# Patient Record
Sex: Male | Born: 1950
Health system: Southern US, Community
[De-identification: ages and names within clinical notes are randomized; demographics above are authoritative.]

## PROBLEM LIST (undated history)

## (undated) DIAGNOSIS — G709 Myoneural disorder, unspecified: Secondary | ICD-10-CM

## (undated) DIAGNOSIS — E039 Hypothyroidism, unspecified: Secondary | ICD-10-CM

## (undated) DIAGNOSIS — E785 Hyperlipidemia, unspecified: Secondary | ICD-10-CM

## (undated) DIAGNOSIS — G4730 Sleep apnea, unspecified: Secondary | ICD-10-CM

## (undated) DIAGNOSIS — F419 Anxiety disorder, unspecified: Secondary | ICD-10-CM

## (undated) DIAGNOSIS — M109 Gout, unspecified: Secondary | ICD-10-CM

## (undated) DIAGNOSIS — M1990 Unspecified osteoarthritis, unspecified site: Secondary | ICD-10-CM

## (undated) HISTORY — PX: MULTIPLE TOOTH EXTRACTIONS: SHX2053

## (undated) HISTORY — PX: BACK SURGERY: SHX140

---

## 2002-04-02 ENCOUNTER — Ambulatory Visit (HOSPITAL_BASED_OUTPATIENT_CLINIC_OR_DEPARTMENT_OTHER): Admission: RE | Admit: 2002-04-02 | Discharge: 2002-04-02 | Payer: Self-pay | Admitting: Pulmonary Disease

## 2002-06-12 ENCOUNTER — Ambulatory Visit (HOSPITAL_BASED_OUTPATIENT_CLINIC_OR_DEPARTMENT_OTHER): Admission: RE | Admit: 2002-06-12 | Discharge: 2002-06-12 | Payer: Self-pay | Admitting: Pulmonary Disease

## 2002-08-15 ENCOUNTER — Encounter: Payer: Self-pay | Admitting: Family Medicine

## 2002-08-15 ENCOUNTER — Encounter: Admission: RE | Admit: 2002-08-15 | Discharge: 2002-08-15 | Payer: Self-pay | Admitting: Family Medicine

## 2002-09-20 ENCOUNTER — Encounter: Payer: Self-pay | Admitting: *Deleted

## 2002-09-20 ENCOUNTER — Ambulatory Visit (HOSPITAL_COMMUNITY): Admission: RE | Admit: 2002-09-20 | Discharge: 2002-09-20 | Payer: Self-pay | Admitting: Endocrinology

## 2009-07-06 ENCOUNTER — Emergency Department (HOSPITAL_COMMUNITY): Admission: EM | Admit: 2009-07-06 | Discharge: 2009-07-06 | Payer: Self-pay | Admitting: Emergency Medicine

## 2009-08-20 ENCOUNTER — Inpatient Hospital Stay (HOSPITAL_COMMUNITY): Admission: RE | Admit: 2009-08-20 | Discharge: 2009-09-15 | Payer: Self-pay | Admitting: Neurological Surgery

## 2009-08-27 ENCOUNTER — Ambulatory Visit: Payer: Self-pay | Admitting: Physical Medicine & Rehabilitation

## 2009-09-19 ENCOUNTER — Emergency Department (HOSPITAL_COMMUNITY): Admission: EM | Admit: 2009-09-19 | Discharge: 2009-09-19 | Payer: Self-pay | Admitting: Emergency Medicine

## 2011-03-10 LAB — CBC
HCT: 27.7 % — ABNORMAL LOW (ref 39.0–52.0)
HCT: 28.3 % — ABNORMAL LOW (ref 39.0–52.0)
Hemoglobin: 10.1 g/dL — ABNORMAL LOW (ref 13.0–17.0)
Hemoglobin: 9.6 g/dL — ABNORMAL LOW (ref 13.0–17.0)
MCHC: 34.3 g/dL (ref 30.0–36.0)
MCHC: 34.8 g/dL (ref 30.0–36.0)
MCHC: 35.5 g/dL (ref 30.0–36.0)
RBC: 2.99 MIL/uL — ABNORMAL LOW (ref 4.22–5.81)
RBC: 3.3 MIL/uL — ABNORMAL LOW (ref 4.22–5.81)
RDW: 13.1 % (ref 11.5–15.5)
WBC: 6.4 10*3/uL (ref 4.0–10.5)

## 2011-03-10 LAB — POCT I-STAT 4, (NA,K, GLUC, HGB,HCT)
Glucose, Bld: 99 mg/dL (ref 70–99)
HCT: 29 % — ABNORMAL LOW (ref 39.0–52.0)
Hemoglobin: 9.9 g/dL — ABNORMAL LOW (ref 13.0–17.0)

## 2011-03-10 LAB — BASIC METABOLIC PANEL
CO2: 33 mEq/L — ABNORMAL HIGH (ref 19–32)
CO2: 33 mEq/L — ABNORMAL HIGH (ref 19–32)
Calcium: 8.4 mg/dL (ref 8.4–10.5)
GFR calc Af Amer: >60 mL/min (ref >60)
Glucose, Bld: 96 mg/dL (ref 70–99)
Potassium: 4.3 mEq/L (ref 3.5–5.1)
Potassium: 4.8 mEq/L (ref 3.5–5.1)
Sodium: 137 mEq/L (ref 135–145)
Sodium: 137 mEq/L (ref 135–145)

## 2011-03-10 LAB — TYPE AND SCREEN

## 2011-03-11 LAB — TYPE AND SCREEN
ABO/RH(D): A POS
ABO/RH(D): A POS
Antibody Screen: NEGATIVE
Antibody Screen: NEGATIVE

## 2011-03-11 LAB — CBC
HCT: 33.7 % — ABNORMAL LOW (ref 39.0–52.0)
HCT: 34.8 % — ABNORMAL LOW (ref 39.0–52.0)
HCT: 47.7 % (ref 39.0–52.0)
Hemoglobin: 12.1 g/dL — ABNORMAL LOW (ref 13.0–17.0)
MCHC: 34.6 g/dL (ref 30.0–36.0)
MCV: 94.2 fL (ref 78.0–100.0)
Platelets: 100 10*3/uL — ABNORMAL LOW (ref 150–400)
Platelets: 142 10*3/uL — ABNORMAL LOW (ref 150–400)
Platelets: 143 10*3/uL — ABNORMAL LOW (ref 150–400)
RBC: 3.7 MIL/uL — ABNORMAL LOW (ref 4.22–5.81)
RDW: 13.3 % (ref 11.5–15.5)
RDW: 13.4 % (ref 11.5–15.5)
RDW: 13.5 % (ref 11.5–15.5)
WBC: 11 10*3/uL — ABNORMAL HIGH (ref 4.0–10.5)
WBC: 6.2 10*3/uL (ref 4.0–10.5)
WBC: 7.3 10*3/uL (ref 4.0–10.5)

## 2011-03-11 LAB — BASIC METABOLIC PANEL
BUN: 16 mg/dL (ref 6–23)
Calcium: 8.4 mg/dL (ref 8.4–10.5)
Chloride: 103 mEq/L (ref 96–112)
Creatinine, Ser: 1.19 mg/dL (ref 0.4–1.5)
GFR calc Af Amer: >60 mL/min (ref >60)
GFR calc non Af Amer: >60 mL/min (ref >60)
GFR calc non Af Amer: >60 mL/min (ref >60)
Glucose, Bld: 112 mg/dL — ABNORMAL HIGH (ref 70–99)
Potassium: 5.8 mEq/L — ABNORMAL HIGH (ref 3.5–5.1)
Sodium: 139 mEq/L (ref 135–145)

## 2011-03-11 LAB — COMPREHENSIVE METABOLIC PANEL
ALT: 14 U/L (ref 0–53)
CO2: 34 mEq/L — ABNORMAL HIGH (ref 19–32)
Calcium: 8.4 mg/dL (ref 8.4–10.5)
GFR calc non Af Amer: >60 mL/min (ref >60)
Glucose, Bld: 91 mg/dL (ref 70–99)
Sodium: 135 mEq/L (ref 135–145)
Total Bilirubin: 0.7 mg/dL (ref 0.3–1.2)

## 2011-03-11 LAB — DIFFERENTIAL
Basophils Absolute: 0 10*3/uL (ref 0.0–0.1)
Basophils Absolute: 0 10*3/uL (ref 0.0–0.1)
Basophils Relative: 0 % (ref 0–1)
Basophils Relative: 1 % (ref 0–1)
Eosinophils Absolute: 0 10*3/uL (ref 0.0–0.7)
Eosinophils Absolute: 0.1 10*3/uL (ref 0.0–0.7)
Eosinophils Relative: 0 % (ref 0–5)
Lymphocytes Relative: 3 % — ABNORMAL LOW (ref 12–46)
Lymphs Abs: 0.4 10*3/uL — ABNORMAL LOW (ref 0.7–4.0)
Lymphs Abs: 1.3 10*3/uL (ref 0.7–4.0)
Monocytes Absolute: 0.3 10*3/uL (ref 0.1–1.0)
Monocytes Relative: 2 % — ABNORMAL LOW (ref 3–12)
Neutro Abs: 10.3 10*3/uL — ABNORMAL HIGH (ref 1.7–7.7)
Neutrophils Relative %: 70 % (ref 43–77)
Neutrophils Relative %: 94 % — ABNORMAL HIGH (ref 43–77)

## 2011-03-11 LAB — ABO/RH: ABO/RH(D): A POS

## 2011-03-11 LAB — CARBAMAZEPINE LEVEL, TOTAL: Carbamazepine Lvl: 9.1 ug/mL (ref 4.0–12.0)

## 2012-12-06 DIAGNOSIS — R030 Elevated blood-pressure reading, without diagnosis of hypertension: Secondary | ICD-10-CM | POA: Diagnosis not present

## 2012-12-06 DIAGNOSIS — M545 Low back pain, unspecified: Secondary | ICD-10-CM | POA: Diagnosis not present

## 2012-12-06 DIAGNOSIS — G4733 Obstructive sleep apnea (adult) (pediatric): Secondary | ICD-10-CM | POA: Diagnosis not present

## 2012-12-06 DIAGNOSIS — F411 Generalized anxiety disorder: Secondary | ICD-10-CM | POA: Diagnosis not present

## 2013-01-29 DIAGNOSIS — M545 Low back pain, unspecified: Secondary | ICD-10-CM | POA: Insufficient documentation

## 2013-02-12 DIAGNOSIS — E785 Hyperlipidemia, unspecified: Secondary | ICD-10-CM | POA: Diagnosis not present

## 2013-02-12 DIAGNOSIS — E039 Hypothyroidism, unspecified: Secondary | ICD-10-CM | POA: Diagnosis not present

## 2013-02-12 DIAGNOSIS — R319 Hematuria, unspecified: Secondary | ICD-10-CM | POA: Diagnosis not present

## 2013-02-12 DIAGNOSIS — M25529 Pain in unspecified elbow: Secondary | ICD-10-CM | POA: Diagnosis not present

## 2013-03-05 DIAGNOSIS — N400 Enlarged prostate without lower urinary tract symptoms: Secondary | ICD-10-CM | POA: Diagnosis not present

## 2013-03-05 DIAGNOSIS — M109 Gout, unspecified: Secondary | ICD-10-CM | POA: Diagnosis not present

## 2013-03-05 DIAGNOSIS — R3129 Other microscopic hematuria: Secondary | ICD-10-CM | POA: Diagnosis not present

## 2013-03-21 ENCOUNTER — Other Ambulatory Visit: Payer: Self-pay | Admitting: Gastroenterology

## 2013-03-21 DIAGNOSIS — N281 Cyst of kidney, acquired: Secondary | ICD-10-CM | POA: Diagnosis not present

## 2013-03-21 DIAGNOSIS — R3129 Other microscopic hematuria: Secondary | ICD-10-CM | POA: Diagnosis not present

## 2013-03-25 DIAGNOSIS — R3129 Other microscopic hematuria: Secondary | ICD-10-CM | POA: Diagnosis not present

## 2013-04-12 ENCOUNTER — Encounter (HOSPITAL_COMMUNITY): Payer: Self-pay | Admitting: *Deleted

## 2013-04-12 ENCOUNTER — Ambulatory Visit (HOSPITAL_COMMUNITY)
Admission: RE | Admit: 2013-04-12 | Discharge: 2013-04-12 | Disposition: A | Discharge status: Home / Self Care | Payer: BC Managed Care – PPO | Source: Ambulatory Visit | Attending: Gastroenterology | Admitting: Gastroenterology

## 2013-04-12 ENCOUNTER — Encounter (HOSPITAL_COMMUNITY)
Admission: RE | Disposition: A | Discharge status: Home / Self Care | Payer: Self-pay | Source: Ambulatory Visit | Attending: Gastroenterology

## 2013-04-12 DIAGNOSIS — D126 Benign neoplasm of colon, unspecified: Secondary | ICD-10-CM | POA: Insufficient documentation

## 2013-04-12 DIAGNOSIS — K644 Residual hemorrhoidal skin tags: Secondary | ICD-10-CM | POA: Insufficient documentation

## 2013-04-12 DIAGNOSIS — K648 Other hemorrhoids: Secondary | ICD-10-CM | POA: Insufficient documentation

## 2013-04-12 DIAGNOSIS — G4730 Sleep apnea, unspecified: Secondary | ICD-10-CM | POA: Insufficient documentation

## 2013-04-12 DIAGNOSIS — E039 Hypothyroidism, unspecified: Secondary | ICD-10-CM | POA: Insufficient documentation

## 2013-04-12 DIAGNOSIS — Z800 Family history of malignant neoplasm of digestive organs: Secondary | ICD-10-CM | POA: Insufficient documentation

## 2013-04-12 DIAGNOSIS — D128 Benign neoplasm of rectum: Secondary | ICD-10-CM | POA: Diagnosis not present

## 2013-04-12 DIAGNOSIS — Z1211 Encounter for screening for malignant neoplasm of colon: Secondary | ICD-10-CM | POA: Insufficient documentation

## 2013-04-12 DIAGNOSIS — E785 Hyperlipidemia, unspecified: Secondary | ICD-10-CM | POA: Insufficient documentation

## 2013-04-12 HISTORY — PX: COLONOSCOPY: SHX5424

## 2013-04-12 HISTORY — DX: Hypothyroidism, unspecified: E03.9

## 2013-04-12 HISTORY — DX: Hyperlipidemia, unspecified: E78.5

## 2013-04-12 HISTORY — DX: Sleep apnea, unspecified: G47.30

## 2013-04-12 HISTORY — DX: Unspecified osteoarthritis, unspecified site: M19.90

## 2013-04-12 HISTORY — DX: Myoneural disorder, unspecified: G70.9

## 2013-04-12 HISTORY — DX: Anxiety disorder, unspecified: F41.9

## 2013-04-12 SURGERY — COLONOSCOPY
Anesthesia: Moderate Sedation

## 2013-04-12 MED ORDER — MIDAZOLAM HCL 5 MG/5ML IJ SOLN
INTRAMUSCULAR | Status: DC | PRN
  Administered 2013-04-12 (×6): 2 mg via INTRAVENOUS

## 2013-04-12 MED ORDER — SODIUM CHLORIDE 0.9 % IV SOLN: INTRAVENOUS | Status: DC

## 2013-04-12 MED ORDER — FENTANYL CITRATE 0.05 MG/ML IJ SOLN
INTRAMUSCULAR | Status: AC
  Filled 2013-04-12: qty 4

## 2013-04-12 MED ORDER — MIDAZOLAM HCL 10 MG/2ML IJ SOLN
INTRAMUSCULAR | Status: AC
  Filled 2013-04-12: qty 4

## 2013-04-12 MED ORDER — FENTANYL CITRATE 0.05 MG/ML IJ SOLN
INTRAMUSCULAR | Status: DC | PRN
  Administered 2013-04-12 (×6): 25 ug via INTRAVENOUS

## 2013-04-12 NOTE — H&P (Signed)
Reason for Consult: Screening colonoscopy Referring Physician: Mack Hook, M.D.  Ramon Dredge HPI: This is a 62 year old male referred for a screening colonoscopy.  He is asymptomatic from the GI standpoint.  His grand mother died of colon cancer in her 19's.  Past Medical History  Diagnosis Date  . Hypothyroidism   . Hyperlipemia   . Sleep apnea     uses Cpap  . Neuromuscular disorder   . Arthritis   . Anxiety     Past Surgical History  Procedure Laterality Date  . Back surgery      x 3  . Multiple tooth extractions      History reviewed. No pertinent family history.  Social History:  reports that he has never smoked. He has never used smokeless tobacco. He reports that he does not drink alcohol or use illicit drugs.  Allergies: No Known Allergies  Medications:  Scheduled:  Continuous: . sodium chloride      No results found for this or any previous visit (from the past 24 hour(s)).   No results found.  ROS:  As stated above in the HPI otherwise negative.  Blood pressure 152/84, pulse 70, temperature 98.4 F (36.9 C), temperature source Oral, resp. rate 17, height 5\' 11"  (1.803 m), weight 240 lb (108.863 kg), SpO2 17.00%.    PE: Gen: NAD, Alert and Oriented HEENT:  [place]/AT, EOMI Neck: Supple, no LAD Lungs: CTA Bilaterally CV: RRR without M/G/R ABM: Soft, NTND, +BS Ext: No C/C/E  Assessment/Plan: 1) Screening colonoscopy.  Plan: 1) Colonoscopy today.  Margeret Stachnik D 04/12/2013, 9:20 AM

## 2013-04-12 NOTE — Op Note (Addendum)
[facility] [address] [place], [address]   OPERATIVE PROCEDURE REPORT  PATIENT: Tyleek, Smick  MR#: [id number] BIRTHDATE: 09/12/1951  GENDER: Male ENDOSCOPIST: Jeani Hawking, MD ASSISTANT:   Jamal Maes, RN Dorisann Frames, technician Jadene Pierini, technician PROCEDURE DATE: 04/12/2013 PROCEDURE:   Colonoscopy with snare polypectomy ASA CLASS:   Class II INDICATIONS:Colorectal cancer screening. MEDICATIONS: Versed 12 mg IV and Fentanyl 150 mcg IV  DESCRIPTION OF PROCEDURE:   After the risks benefits and alternatives of the procedure were thoroughly explained, informed consent was obtained.  A digital rectal exam revealed no abnormalities of the rectum.    The Pentax Ped Colon [id number] endoscope was introduced through the anus  and advanced to the cecum, which was identified by both the appendix and ileocecal valve , No adverse events experienced.    The quality of the prep was excellent. .  The instrument was then slowly withdrawn as the colon was fully examined.     FINDINGS: A 3 mm sessile descending colon polyp was removed with a cold sanre.  No evidence of any masses, inflammation, ulcerations, erosions, or vascular abnormalities.   Retroflexed views revealed internal/external hemorrhoids.     The scope was then withdrawn from the patient and the procedure terminated.  COMPLICATIONS: There were no complications.  IMPRESSION: 1) Polyp. 2) Int/Ext Hemorrhoids.  RECOMMENDATIONS: 1.  Await biopsy results 2.  Repeat colonoscopy in 5-10 years.   _______________________________ eSignedJeani Hawking, MD 04/12/2013 9:57 AM

## 2013-04-15 ENCOUNTER — Encounter (HOSPITAL_COMMUNITY): Payer: Self-pay | Admitting: Gastroenterology

## 2013-05-23 DIAGNOSIS — E039 Hypothyroidism, unspecified: Secondary | ICD-10-CM | POA: Diagnosis not present

## 2013-05-23 DIAGNOSIS — E785 Hyperlipidemia, unspecified: Secondary | ICD-10-CM | POA: Diagnosis not present

## 2013-08-12 DIAGNOSIS — D485 Neoplasm of uncertain behavior of skin: Secondary | ICD-10-CM | POA: Diagnosis not present

## 2013-08-29 DIAGNOSIS — E785 Hyperlipidemia, unspecified: Secondary | ICD-10-CM | POA: Diagnosis not present

## 2013-08-29 DIAGNOSIS — E039 Hypothyroidism, unspecified: Secondary | ICD-10-CM | POA: Diagnosis not present

## 2013-08-29 DIAGNOSIS — Z23 Encounter for immunization: Secondary | ICD-10-CM | POA: Diagnosis not present

## 2013-11-05 DIAGNOSIS — H6690 Otitis media, unspecified, unspecified ear: Secondary | ICD-10-CM | POA: Diagnosis not present

## 2014-02-26 DIAGNOSIS — E785 Hyperlipidemia, unspecified: Secondary | ICD-10-CM | POA: Diagnosis not present

## 2014-02-26 DIAGNOSIS — G4733 Obstructive sleep apnea (adult) (pediatric): Secondary | ICD-10-CM | POA: Diagnosis not present

## 2014-02-26 DIAGNOSIS — F411 Generalized anxiety disorder: Secondary | ICD-10-CM | POA: Diagnosis not present

## 2014-02-26 DIAGNOSIS — E039 Hypothyroidism, unspecified: Secondary | ICD-10-CM | POA: Diagnosis not present

## 2014-08-28 DIAGNOSIS — F411 Generalized anxiety disorder: Secondary | ICD-10-CM | POA: Diagnosis not present

## 2014-08-28 DIAGNOSIS — G4733 Obstructive sleep apnea (adult) (pediatric): Secondary | ICD-10-CM | POA: Diagnosis not present

## 2014-08-28 DIAGNOSIS — E039 Hypothyroidism, unspecified: Secondary | ICD-10-CM | POA: Diagnosis not present

## 2014-08-28 DIAGNOSIS — E785 Hyperlipidemia, unspecified: Secondary | ICD-10-CM | POA: Diagnosis not present

## 2015-07-11 ENCOUNTER — Emergency Department
Admission: EM | Admit: 2015-07-11 | Discharge: 2015-07-11 | Disposition: A | Discharge status: Home / Self Care | Payer: No Typology Code available for payment source | Attending: Emergency Medicine | Admitting: Emergency Medicine

## 2015-07-11 ENCOUNTER — Encounter: Payer: Self-pay | Admitting: Emergency Medicine

## 2015-07-11 ENCOUNTER — Emergency Department: Payer: No Typology Code available for payment source

## 2015-07-11 DIAGNOSIS — Y9389 Activity, other specified: Secondary | ICD-10-CM | POA: Insufficient documentation

## 2015-07-11 DIAGNOSIS — Y998 Other external cause status: Secondary | ICD-10-CM | POA: Diagnosis not present

## 2015-07-11 DIAGNOSIS — F419 Anxiety disorder, unspecified: Secondary | ICD-10-CM | POA: Diagnosis not present

## 2015-07-11 DIAGNOSIS — S39012A Strain of muscle, fascia and tendon of lower back, initial encounter: Secondary | ICD-10-CM

## 2015-07-11 DIAGNOSIS — Y92410 Unspecified street and highway as the place of occurrence of the external cause: Secondary | ICD-10-CM | POA: Insufficient documentation

## 2015-07-11 DIAGNOSIS — Z79899 Other long term (current) drug therapy: Secondary | ICD-10-CM | POA: Diagnosis not present

## 2015-07-11 DIAGNOSIS — S161XXA Strain of muscle, fascia and tendon at neck level, initial encounter: Secondary | ICD-10-CM

## 2015-07-11 DIAGNOSIS — S199XXA Unspecified injury of neck, initial encounter: Secondary | ICD-10-CM | POA: Diagnosis present

## 2015-07-11 MED ORDER — HYDROXYZINE HCL 50 MG PO TABS
50.0000 mg | ORAL_TABLET | Freq: Once | ORAL | Status: AC
  Administered 2015-07-11: 50 mg via ORAL
  Filled 2015-07-11: qty 1

## 2015-07-11 MED ORDER — HYDROMORPHONE HCL 1 MG/ML IJ SOLN
0.5000 mg | Freq: Once | INTRAMUSCULAR | Status: AC
  Administered 2015-07-11: 0.5 mg via INTRAMUSCULAR
  Filled 2015-07-11: qty 1

## 2015-07-11 MED ORDER — ORPHENADRINE CITRATE 30 MG/ML IJ SOLN
60.0000 mg | Freq: Two times a day (BID) | INTRAMUSCULAR | Status: DC
  Administered 2015-07-11: 60 mg via INTRAVENOUS
  Filled 2015-07-11: qty 2
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
## 2015-07-11 NOTE — Discharge Instructions (Signed)
Continue previous mediations.  Follow up with Pain ManagementDoctor.
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
## 2015-07-14 ENCOUNTER — Emergency Department (INDEPENDENT_AMBULATORY_CARE_PROVIDER_SITE_OTHER)
Admission: EM | Admit: 2015-07-14 | Discharge: 2015-07-14 | Disposition: A | Discharge status: Home / Self Care | Payer: Self-pay | Source: Home / Self Care | Attending: Emergency Medicine | Admitting: Emergency Medicine

## 2015-07-14 ENCOUNTER — Encounter (HOSPITAL_COMMUNITY): Payer: Self-pay | Admitting: Emergency Medicine

## 2015-07-14 DIAGNOSIS — S161XXA Strain of muscle, fascia and tendon at neck level, initial encounter: Secondary | ICD-10-CM

## 2015-07-14 MED ORDER — CYCLOBENZAPRINE HCL 5 MG PO TABS: 5.0000 mg | ORAL_TABLET | Freq: Three times a day (TID) | ORAL | Status: DC | PRN
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
## 2015-07-14 NOTE — Discharge Instructions (Signed)
Your spine and spinal cord are good. You strained the muscles in your neck. I think you may have a small tear in one of your neck muscles. Take Flexeril 1-2 tablets every 8 hours as needed. Apply heat to the affected area for 20 minutes every 1-2 hours. This should start to improve over the next few days. It will take several weeks to fully resolve. Follow-up with your doctor as needed.
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
## 2016-05-26 DIAGNOSIS — E785 Hyperlipidemia, unspecified: Secondary | ICD-10-CM | POA: Diagnosis not present

## 2016-05-26 DIAGNOSIS — Z125 Encounter for screening for malignant neoplasm of prostate: Secondary | ICD-10-CM | POA: Diagnosis not present

## 2016-05-26 DIAGNOSIS — Z0000 Encounter for general adult medical examination without abnormal findings: Secondary | ICD-10-CM | POA: Diagnosis not present

## 2016-06-02 DIAGNOSIS — F339 Major depressive disorder, recurrent, unspecified: Secondary | ICD-10-CM | POA: Diagnosis not present

## 2016-06-02 DIAGNOSIS — D696 Thrombocytopenia, unspecified: Secondary | ICD-10-CM | POA: Diagnosis not present

## 2016-06-02 DIAGNOSIS — E785 Hyperlipidemia, unspecified: Secondary | ICD-10-CM | POA: Diagnosis not present

## 2016-06-02 DIAGNOSIS — E039 Hypothyroidism, unspecified: Secondary | ICD-10-CM | POA: Diagnosis not present

## 2016-08-12 DIAGNOSIS — H0011 Chalazion right upper eyelid: Secondary | ICD-10-CM | POA: Diagnosis not present

## 2017-05-23 DIAGNOSIS — Z125 Encounter for screening for malignant neoplasm of prostate: Secondary | ICD-10-CM | POA: Diagnosis not present

## 2017-05-23 DIAGNOSIS — E785 Hyperlipidemia, unspecified: Secondary | ICD-10-CM | POA: Diagnosis not present

## 2017-05-23 DIAGNOSIS — Z0000 Encounter for general adult medical examination without abnormal findings: Secondary | ICD-10-CM | POA: Diagnosis not present

## 2017-06-02 DIAGNOSIS — E039 Hypothyroidism, unspecified: Secondary | ICD-10-CM | POA: Diagnosis not present

## 2017-06-02 DIAGNOSIS — F419 Anxiety disorder, unspecified: Secondary | ICD-10-CM | POA: Diagnosis not present

## 2017-06-02 DIAGNOSIS — G4733 Obstructive sleep apnea (adult) (pediatric): Secondary | ICD-10-CM | POA: Diagnosis not present

## 2017-06-02 DIAGNOSIS — Z0000 Encounter for general adult medical examination without abnormal findings: Secondary | ICD-10-CM | POA: Diagnosis not present
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
## 2018-03-28 DIAGNOSIS — R69 Illness, unspecified: Secondary | ICD-10-CM | POA: Diagnosis not present

## 2018-05-01 DIAGNOSIS — H00021 Hordeolum internum right upper eyelid: Secondary | ICD-10-CM | POA: Diagnosis not present

## 2018-05-29 DIAGNOSIS — E785 Hyperlipidemia, unspecified: Secondary | ICD-10-CM | POA: Diagnosis not present

## 2018-05-29 DIAGNOSIS — Z125 Encounter for screening for malignant neoplasm of prostate: Secondary | ICD-10-CM | POA: Diagnosis not present

## 2018-05-29 DIAGNOSIS — Z0000 Encounter for general adult medical examination without abnormal findings: Secondary | ICD-10-CM | POA: Diagnosis not present

## 2018-05-29 DIAGNOSIS — E039 Hypothyroidism, unspecified: Secondary | ICD-10-CM | POA: Diagnosis not present

## 2018-05-29 DIAGNOSIS — H0011 Chalazion right upper eyelid: Secondary | ICD-10-CM | POA: Diagnosis not present

## 2018-06-05 DIAGNOSIS — E782 Mixed hyperlipidemia: Secondary | ICD-10-CM | POA: Diagnosis not present

## 2018-06-05 DIAGNOSIS — R69 Illness, unspecified: Secondary | ICD-10-CM | POA: Diagnosis not present

## 2018-06-05 DIAGNOSIS — Z0000 Encounter for general adult medical examination without abnormal findings: Secondary | ICD-10-CM | POA: Diagnosis not present

## 2018-06-05 DIAGNOSIS — E039 Hypothyroidism, unspecified: Secondary | ICD-10-CM | POA: Diagnosis not present

## 2018-06-05 DIAGNOSIS — Z993 Dependence on wheelchair: Secondary | ICD-10-CM | POA: Diagnosis not present

## 2018-06-05 DIAGNOSIS — G4733 Obstructive sleep apnea (adult) (pediatric): Secondary | ICD-10-CM | POA: Diagnosis not present

## 2018-07-11 DIAGNOSIS — R2231 Localized swelling, mass and lump, right upper limb: Secondary | ICD-10-CM | POA: Diagnosis not present

## 2018-07-11 DIAGNOSIS — M79641 Pain in right hand: Secondary | ICD-10-CM | POA: Diagnosis not present

## 2018-07-11 DIAGNOSIS — M25531 Pain in right wrist: Secondary | ICD-10-CM | POA: Diagnosis not present

## 2018-07-11 DIAGNOSIS — M7989 Other specified soft tissue disorders: Secondary | ICD-10-CM | POA: Diagnosis not present

## 2018-07-11 DIAGNOSIS — E039 Hypothyroidism, unspecified: Secondary | ICD-10-CM | POA: Diagnosis not present

## 2018-07-11 DIAGNOSIS — Z79899 Other long term (current) drug therapy: Secondary | ICD-10-CM | POA: Diagnosis not present

## 2018-07-12 ENCOUNTER — Other Ambulatory Visit: Payer: Self-pay

## 2018-07-12 ENCOUNTER — Emergency Department (HOSPITAL_COMMUNITY)
Admission: EM | Admit: 2018-07-12 | Discharge: 2018-07-12 | Disposition: A | Discharge status: Home / Self Care | Payer: Medicare HMO | Attending: Emergency Medicine | Admitting: Emergency Medicine

## 2018-07-12 ENCOUNTER — Encounter (HOSPITAL_COMMUNITY): Payer: Self-pay | Admitting: Emergency Medicine

## 2018-07-12 ENCOUNTER — Emergency Department (HOSPITAL_COMMUNITY): Payer: Medicare HMO

## 2018-07-12 DIAGNOSIS — E039 Hypothyroidism, unspecified: Secondary | ICD-10-CM | POA: Diagnosis not present

## 2018-07-12 DIAGNOSIS — R2231 Localized swelling, mass and lump, right upper limb: Secondary | ICD-10-CM | POA: Diagnosis not present

## 2018-07-12 DIAGNOSIS — M7989 Other specified soft tissue disorders: Secondary | ICD-10-CM | POA: Diagnosis not present

## 2018-07-12 DIAGNOSIS — M79641 Pain in right hand: Secondary | ICD-10-CM | POA: Diagnosis not present

## 2018-07-12 DIAGNOSIS — M25431 Effusion, right wrist: Secondary | ICD-10-CM

## 2018-07-12 DIAGNOSIS — M25531 Pain in right wrist: Secondary | ICD-10-CM

## 2018-07-12 DIAGNOSIS — Z79899 Other long term (current) drug therapy: Secondary | ICD-10-CM | POA: Diagnosis not present

## 2018-07-12 LAB — CBC WITH DIFFERENTIAL/PLATELET
BASOS ABS: 0 10*3/uL (ref 0.0–0.1)
Basophils Relative: 0 %
EOS PCT: 0 %
Eosinophils Absolute: 0.1 10*3/uL (ref 0.0–0.7)
HCT: 44.9 % (ref 39.0–52.0)
Hemoglobin: 15.6 g/dL (ref 13.0–17.0)
LYMPHS PCT: 8 %
Lymphs Abs: 1 10*3/uL (ref 0.7–4.0)
MCH: 32.6 pg (ref 26.0–34.0)
MCHC: 34.7 g/dL (ref 30.0–36.0)
MCV: 93.7 fL (ref 78.0–100.0)
Monocytes Absolute: 1.2 10*3/uL — ABNORMAL HIGH (ref 0.1–1.0)
Monocytes Relative: 9 %
Neutro Abs: 10.1 10*3/uL — ABNORMAL HIGH (ref 1.7–7.7)
Neutrophils Relative %: 83 %
PLATELETS: 141 10*3/uL — AB (ref 150–400)
RBC: 4.79 MIL/uL (ref 4.22–5.81)
RDW: 13.4 % (ref 11.5–15.5)
WBC: 12.3 10*3/uL — ABNORMAL HIGH (ref 4.0–10.5)

## 2018-07-12 LAB — BASIC METABOLIC PANEL
ANION GAP: 6 (ref 5–15)
BUN: 17 mg/dL (ref 8–23)
CALCIUM: 9.2 mg/dL (ref 8.9–10.3)
CO2: 30 mmol/L (ref 22–32)
CREATININE: 1.09 mg/dL (ref 0.61–1.24)
Chloride: 99 mmol/L (ref 98–111)
GFR calc Af Amer: >60 mL/min (ref >60)
GLUCOSE: 113 mg/dL — AB (ref 70–99)
Potassium: 4.3 mmol/L (ref 3.5–5.1)
Sodium: 135 mmol/L (ref 135–145)

## 2018-07-12 LAB — URIC ACID: Uric Acid, Serum: 7.6 mg/dL (ref 3.7–8.6)

## 2018-07-12 MED ORDER — POVIDONE-IODINE 10 % EX SOLN
CUTANEOUS | Status: AC
  Administered 2018-07-12: 1
  Filled 2018-07-12: qty 30

## 2018-07-12 MED ORDER — NAPROXEN 500 MG PO TABS: 500.0000 mg | ORAL_TABLET | Freq: Two times a day (BID) | ORAL | 0 refills | Status: DC

## 2018-07-12 MED ORDER — DOXYCYCLINE HYCLATE 100 MG PO CAPS: 100.0000 mg | ORAL_CAPSULE | Freq: Two times a day (BID) | ORAL | 0 refills | Status: DC

## 2018-07-12 MED ORDER — PREDNISONE 10 MG PO TABS
60.0000 mg | ORAL_TABLET | Freq: Once | ORAL | Status: AC
Start: 2018-07-12 — End: 2018-07-12
  Administered 2018-07-12: 60 mg via ORAL
  Filled 2018-07-12: qty 1

## 2018-07-12 MED ORDER — COLCHICINE 0.6 MG PO TABS
0.6000 mg | ORAL_TABLET | Freq: Once | ORAL | Status: AC
Start: 2018-07-12 — End: 2018-07-12
  Administered 2018-07-12: 0.6 mg via ORAL
  Filled 2018-07-12: qty 1

## 2018-07-12 MED ORDER — OXYCODONE-ACETAMINOPHEN 5-325 MG PO TABS
2.0000 | ORAL_TABLET | Freq: Once | ORAL | Status: AC
  Administered 2018-07-12: 2 via ORAL
  Filled 2018-07-12: qty 2

## 2018-07-12 MED ORDER — COLCHICINE 0.6 MG PO TABS: 0.6000 mg | ORAL_TABLET | Freq: Two times a day (BID) | ORAL | 0 refills | Status: AC

## 2018-07-12 NOTE — ED Provider Notes (Signed)
[facility] EMERGENCY DEPARTMENT Provider Note   CSN: [id number] Arrival date & time: 07/11/18  2356     History   Chief Complaint Chief Complaint  Patient presents with  . Wrist Pain    HPI Ricky Hoffman is a 67 y.o. male.  Patient presents with 3-day history of right hand and wrist pain denies injury.  States this started his pain in his second MCP joint but has progressed up to his wrist causing pain and having difficulty moving his wrist.  No fall or trauma.  No fever.  Does have a history of gout but has never had any joint aspirations.  He takes chronic pain medication Nucynta for his back but this is not helping his wrist.  He denies any other joint pain.  Denies any fever, chills, nausea or vomiting.  No weakness, numbness or tingling.  The history is provided by the patient.  Wrist Pain  Pertinent negatives include no chest pain, no abdominal pain and no shortness of breath.    Past Medical History:  Diagnosis Date  . Anxiety   . Arthritis   . Hyperlipemia   . Hypothyroidism   . Neuromuscular disorder ([place])   . Sleep apnea    uses Cpap    There are no active problems to display for this patient.   Past Surgical History:  Procedure Laterality Date  . BACK SURGERY     x 3  . COLONOSCOPY N/A 04/12/2013   Procedure: COLONOSCOPY;  Surgeon: Beryle Beams, MD;  Location: [facility];  Service: Endoscopy;  Laterality: N/A;  . MULTIPLE TOOTH EXTRACTIONS          Home Medications    Prior to Admission medications   Medication Sig Start Date End Date Taking? Authorizing Provider  cyclobenzaprine (FLEXERIL) 5 MG tablet Take 1-2 tablets (5-10 mg total) by mouth 3 (three) times daily as needed for muscle spasms. 07/14/15   Melony Overly, MD  DIAZEPAM PO Take 5 mg by mouth every 6 (six) hours as needed.    [provider]  escitalopram (LEXAPRO) 10 MG tablet Take 10 mg by mouth daily.    [provider]  ESCITALOPRAM OXALATE PO Take by mouth.     [provider]  gabapentin (NEURONTIN) 100 MG capsule Take 100 mg by mouth daily.    [provider]  Gabapentin, Once-Daily, (GRALISE) 300 MG TABS Take 900 mg by mouth daily before supper.    [provider]  L-Methylfolate-B6-B12 (METANX PO) Take by mouth every 12 (twelve) hours.    [provider]  l-methylfolate-B6-B12 (METANX) 3-35-2 MG TABS Take 1 tablet by mouth daily.    [provider]  levothyroxine (SYNTHROID, LEVOTHROID) 50 MCG tablet Take 50 mcg by mouth daily before breakfast.    [provider]  Levothyroxine Sodium 25 MCG CAPS Take by mouth daily before breakfast.    [provider]  NON FORMULARY 600 mg daily. Alpha Lipoic Acid    [provider]  tapentadol (NUCYNTA) 50 MG TABS tablet Take 150 mg by mouth every 4 (four) hours as needed.    [provider]  Tapentadol HCl (NUCYNTA PO) Take 500 mg by mouth every 4 (four) hours as needed.    [provider]    Family History No family history on file.  Social History Social History   Tobacco Use  . Smoking status: Never Smoker  . Smokeless tobacco: Never Used  Substance Use Topics  . Alcohol use: No  .  Drug use: No     Allergies   Patient has no known allergies.   Review of Systems Review of Systems  Constitutional: Negative for activity change, appetite change and fever.  HENT: Negative for congestion.   Respiratory: Negative for cough, chest tightness and shortness of breath.   Cardiovascular: Negative for chest pain.  Gastrointestinal: Negative for abdominal pain, nausea and vomiting.  Genitourinary: Negative for discharge, dysuria, genital sores and urgency.  Musculoskeletal: Positive for arthralgias and myalgias.  Skin: Negative for rash and wound.  Neurological: Negative for dizziness, weakness, light-headedness and numbness.    all other systems are negative except as noted in the HPI and PMH.    Physical  Exam Updated Vital Signs BP 130/80   Pulse 86   Temp 98.2 F (36.8 C)   Resp 18   Ht 5\' 11"  (1.803 m)   Wt 112.5 kg   SpO2 97%   BMI 34.59 kg/m   Physical Exam  Constitutional: He is oriented to person, place, and time. He appears well-developed and well-nourished. No distress.  HENT:  Head: Normocephalic and atraumatic.  Mouth/Throat: Oropharynx is clear and moist. No oropharyngeal exudate.  Eyes: Pupils are equal, round, and reactive to light. Conjunctivae and EOM are normal.  Neck: Normal range of motion. Neck supple.  No meningismus.  Cardiovascular: Normal rate, regular rhythm, normal heart sounds and intact distal pulses.  No murmur heard. Pulmonary/Chest: Effort normal and breath sounds normal. No respiratory distress.  Abdominal: Soft. There is no tenderness. There is no rebound and no guarding.  Musculoskeletal: He exhibits edema and tenderness.  Right wrist is diffusely swollen and tender with reduced range of motion.  There is no significant erythema.  It is warm to palpation.  Intact radial pulse.  There is some erythema overlying his second MCP joint as well.  He is able to flex and extend all of his fingers and thumb.  Reduced range of motion at wrist secondary to pain.  See photograph  Neurological: He is alert and oriented to person, place, and time. No cranial nerve deficit. He exhibits normal muscle tone. Coordination normal.  No ataxia on finger to nose bilaterally. No pronator drift. 5/5 strength throughout. CN 2-12 intact.Equal grip strength. Sensation intact.   Skin: Skin is warm.  Psychiatric: He has a normal mood and affect. His behavior is normal.  Nursing note and vitals reviewed.        ED Treatments / Results  Labs (all labs ordered are listed, but only abnormal results are displayed) Labs Reviewed  CBC WITH DIFFERENTIAL/PLATELET - Abnormal; Notable for the following components:      Result Value   WBC 12.3 (*)    Platelets 141 (*)     Neutro Abs 10.1 (*)    Monocytes Absolute 1.2 (*)    All other components within normal limits  BASIC METABOLIC PANEL - Abnormal; Notable for the following components:   Glucose, Bld 113 (*)    All other components within normal limits  URIC ACID    EKG None  Radiology Dg Wrist Complete Right  Result Date: 07/12/2018 CLINICAL DATA:  Acute onset of right hand pain, extending to the wrist. EXAM: RIGHT WRIST - COMPLETE 3+ VIEW COMPARISON:  None. FINDINGS: There is no evidence of fracture or dislocation. The carpal rows are intact, and demonstrate normal alignment. The joint spaces are preserved. Mild negative ulnar variance is noted. Subcortical cystic change is noted at the distal radius, with mild associated sclerosis. Soft  tissue swelling is noted about the wrist. IMPRESSION: No evidence of fracture or dislocation. Electronically Signed   By: Garald Balding M.D.   On: 07/12/2018 00:53   Dg Hand Complete Right  Result Date: 07/12/2018 CLINICAL DATA:  Acute onset of right hand pain at the head of the second metacarpal. Initial encounter. EXAM: RIGHT HAND - COMPLETE 3+ VIEW COMPARISON:  None. FINDINGS: There is no evidence of fracture or dislocation. Slight deformity of the distal second metatarsal appears to reflect remote traumatic injury. No osseous erosions are seen. The joint spaces are preserved. The carpal rows are intact, and demonstrate normal alignment. Mild negative ulnar variance is noted. Mild soft tissue swelling is noted about the wrist. IMPRESSION: No evidence of fracture or dislocation. Electronically Signed   By: Garald Balding M.D.   On: 07/12/2018 00:52    Procedures .Joint Aspiration/Arthrocentesis Date/Time: 07/12/2018 5:43 AM Performed by: Ezequiel Essex, MD Authorized by: Ezequiel Essex, MD   Consent:    Consent obtained:  Verbal   Consent given by:  Patient   Risks discussed:  Infection, nerve damage and pain Location:    Location:  Wrist   Wrist:  R  radiocarpal Anesthesia (see MAR for exact dosages):    Anesthesia method:  None Procedure details:    Preparation: Patient was prepped and draped in usual sterile fashion     Needle gauge:  18 G   Ultrasound guidance: no     Approach:  Medial   Aspirate amount:  0   Steroid injected: no     Specimen collected: no   Post-procedure details:    Dressing:  Adhesive bandage   Patient tolerance of procedure:  Tolerated well, no immediate complications Comments:     No fluid obtained   (including critical care time)  Medications Ordered in ED Medications  oxyCODONE-acetaminophen (PERCOCET/ROXICET) 5-325 MG per tablet 2 tablet (2 tablets Oral Given 07/12/18 0402)  colchicine tablet 0.6 mg (0.6 mg Oral Given 07/12/18 0403)  predniSONE (DELTASONE) tablet 60 mg (60 mg Oral Given 07/12/18 0403)     Initial Impression / Assessment and Plan / ED Course  I have reviewed the triage vital signs and the nursing notes.  Pertinent labs & imaging results that were available during my care of the patient were reviewed by me and considered in my medical decision making (see chart for details).    Atraumatic right wrist pain with history of questionable gout.  No fever. No pain in other joints.   Reduced range of motion due to pain.  X-rays are negative with minimal effusion but some soft tissue swelling.  Discussed arthrocentesis with patient as this is the only way to rule out septic joint.  However patient has no immunocompromised status and no risk factors for septic joint.  He agrees to lab work.  Mild leukocytosis.  Uric acid level is negative.  Risks and benefits of arthrocentesis discussed.  He agrees to proceed.  Aspiration was not successful and no synovial fluid was obtained.  Discussed with Dr. Caralyn Guile of hand surgery.  He agrees with treating for gout as well as prophylactic antibiotics and splinting the wrist.  He will see in the office on August 9 or August 12. He recommends  anti-inflammatories, antibiotics and pain control.  Discussed with patient.  He understands that septic joint is not ruled out but seems less likely at this time. Follow up with Dr. Caralyn Guile. Return precautions discussed.   Final Clinical Impressions(s) / ED Diagnoses  Final diagnoses:  Pain and swelling of right wrist    ED Discharge Orders    None       Shaney Deckman, Annie Main, MD 07/12/18 [phone]

## 2018-07-12 NOTE — ED Triage Notes (Signed)
Pt c/o right hand/wrist pain since Sunday with no injury.

## 2018-07-12 NOTE — Discharge Instructions (Signed)
As we discussed, we suspect your pain is due to gout but cannot rule out infection.  Take the anti-inflammatories and antibiotics as prescribed.  Follow-up with Dr. Caralyn Guile on Friday or Monday.  Call his office for an appointment today.  Return to the ED if develop worsening pain, fever, pain in your other joints or any other concerns.

## 2018-07-20 DIAGNOSIS — M1000 Idiopathic gout, unspecified site: Secondary | ICD-10-CM | POA: Insufficient documentation

## 2018-08-03 DIAGNOSIS — M1000 Idiopathic gout, unspecified site: Secondary | ICD-10-CM | POA: Diagnosis not present

## 2018-09-03 DIAGNOSIS — M10041 Idiopathic gout, right hand: Secondary | ICD-10-CM | POA: Diagnosis not present

## 2018-09-03 DIAGNOSIS — M1000 Idiopathic gout, unspecified site: Secondary | ICD-10-CM | POA: Diagnosis not present

## 2018-09-19 DIAGNOSIS — R69 Illness, unspecified: Secondary | ICD-10-CM | POA: Diagnosis not present

## 2018-12-25 DIAGNOSIS — R69 Illness, unspecified: Secondary | ICD-10-CM | POA: Diagnosis not present

## 2019-06-03 DIAGNOSIS — I10 Essential (primary) hypertension: Secondary | ICD-10-CM | POA: Diagnosis not present

## 2019-06-03 DIAGNOSIS — E612 Magnesium deficiency: Secondary | ICD-10-CM | POA: Diagnosis not present

## 2019-06-03 DIAGNOSIS — Z125 Encounter for screening for malignant neoplasm of prostate: Secondary | ICD-10-CM | POA: Diagnosis not present

## 2019-06-03 DIAGNOSIS — Z0000 Encounter for general adult medical examination without abnormal findings: Secondary | ICD-10-CM | POA: Diagnosis not present

## 2019-06-03 DIAGNOSIS — R739 Hyperglycemia, unspecified: Secondary | ICD-10-CM | POA: Diagnosis not present

## 2019-06-03 DIAGNOSIS — E785 Hyperlipidemia, unspecified: Secondary | ICD-10-CM | POA: Diagnosis not present

## 2019-06-03 DIAGNOSIS — E039 Hypothyroidism, unspecified: Secondary | ICD-10-CM | POA: Diagnosis not present

## 2019-06-10 DIAGNOSIS — I10 Essential (primary) hypertension: Secondary | ICD-10-CM | POA: Diagnosis not present

## 2019-06-10 DIAGNOSIS — G4733 Obstructive sleep apnea (adult) (pediatric): Secondary | ICD-10-CM | POA: Diagnosis not present

## 2019-06-10 DIAGNOSIS — M545 Low back pain: Secondary | ICD-10-CM | POA: Diagnosis not present

## 2019-06-10 DIAGNOSIS — D696 Thrombocytopenia, unspecified: Secondary | ICD-10-CM | POA: Diagnosis not present

## 2019-06-10 DIAGNOSIS — E039 Hypothyroidism, unspecified: Secondary | ICD-10-CM | POA: Diagnosis not present

## 2019-06-10 DIAGNOSIS — Z0000 Encounter for general adult medical examination without abnormal findings: Secondary | ICD-10-CM | POA: Diagnosis not present

## 2019-06-10 DIAGNOSIS — E782 Mixed hyperlipidemia: Secondary | ICD-10-CM | POA: Diagnosis not present

## 2019-09-05 DIAGNOSIS — E039 Hypothyroidism, unspecified: Secondary | ICD-10-CM | POA: Diagnosis not present

## 2019-09-05 DIAGNOSIS — E782 Mixed hyperlipidemia: Secondary | ICD-10-CM | POA: Diagnosis not present

## 2019-09-11 DIAGNOSIS — D696 Thrombocytopenia, unspecified: Secondary | ICD-10-CM | POA: Diagnosis not present

## 2019-09-11 DIAGNOSIS — M545 Low back pain: Secondary | ICD-10-CM | POA: Diagnosis not present

## 2019-09-11 DIAGNOSIS — Z23 Encounter for immunization: Secondary | ICD-10-CM | POA: Diagnosis not present

## 2019-09-11 DIAGNOSIS — R69 Illness, unspecified: Secondary | ICD-10-CM | POA: Diagnosis not present

## 2019-09-11 DIAGNOSIS — E039 Hypothyroidism, unspecified: Secondary | ICD-10-CM | POA: Diagnosis not present

## 2019-09-11 DIAGNOSIS — E785 Hyperlipidemia, unspecified: Secondary | ICD-10-CM | POA: Diagnosis not present

## 2019-09-17 DIAGNOSIS — R69 Illness, unspecified: Secondary | ICD-10-CM | POA: Diagnosis not present

## 2019-11-12 ENCOUNTER — Other Ambulatory Visit (HOSPITAL_COMMUNITY): Payer: Self-pay | Admitting: Internal Medicine

## 2019-11-12 ENCOUNTER — Other Ambulatory Visit: Payer: Self-pay | Admitting: Internal Medicine

## 2019-11-12 DIAGNOSIS — K115 Sialolithiasis: Secondary | ICD-10-CM

## 2019-11-12 DIAGNOSIS — E039 Hypothyroidism, unspecified: Secondary | ICD-10-CM | POA: Diagnosis not present

## 2019-11-12 DIAGNOSIS — E785 Hyperlipidemia, unspecified: Secondary | ICD-10-CM | POA: Diagnosis not present

## 2019-11-13 ENCOUNTER — Other Ambulatory Visit: Payer: Self-pay | Admitting: Internal Medicine

## 2019-11-13 ENCOUNTER — Other Ambulatory Visit (HOSPITAL_COMMUNITY): Payer: Self-pay | Admitting: Internal Medicine

## 2019-11-13 DIAGNOSIS — K115 Sialolithiasis: Secondary | ICD-10-CM

## 2019-11-28 ENCOUNTER — Other Ambulatory Visit: Payer: Self-pay

## 2019-11-28 ENCOUNTER — Ambulatory Visit (HOSPITAL_COMMUNITY)
Admission: RE | Admit: 2019-11-28 | Discharge: 2019-11-28 | Disposition: A | Discharge status: Home / Self Care | Payer: Medicare HMO | Source: Ambulatory Visit | Attending: Internal Medicine | Admitting: Internal Medicine

## 2019-11-28 DIAGNOSIS — R221 Localized swelling, mass and lump, neck: Secondary | ICD-10-CM | POA: Diagnosis not present

## 2019-11-28 DIAGNOSIS — K115 Sialolithiasis: Secondary | ICD-10-CM | POA: Insufficient documentation

## 2019-11-28 LAB — POCT I-STAT, CHEM 8
BUN: 16 mg/dL (ref 8–23)
Calcium, Ion: 1.2 mmol/L (ref 1.15–1.40)
Chloride: 98 mmol/L (ref 98–111)
Creatinine, Ser: 1.3 mg/dL — ABNORMAL HIGH (ref 0.61–1.24)
Glucose, Bld: 94 mg/dL (ref 70–99)
HCT: 46 % (ref 39.0–52.0)
Hemoglobin: 15.6 g/dL (ref 13.0–17.0)
Potassium: 4.3 mmol/L (ref 3.5–5.1)
Sodium: 139 mmol/L (ref 135–145)
TCO2: 30 mmol/L (ref 22–32)

## 2019-11-28 MED ORDER — IOHEXOL 300 MG/ML  SOLN
75.0000 mL | Freq: Once | INTRAMUSCULAR | Status: AC | PRN
  Administered 2019-11-28: 75 mL via INTRAVENOUS

## 2020-03-11 DIAGNOSIS — D696 Thrombocytopenia, unspecified: Secondary | ICD-10-CM | POA: Diagnosis not present

## 2020-03-11 DIAGNOSIS — E785 Hyperlipidemia, unspecified: Secondary | ICD-10-CM | POA: Diagnosis not present

## 2020-03-11 DIAGNOSIS — E039 Hypothyroidism, unspecified: Secondary | ICD-10-CM | POA: Diagnosis not present

## 2020-03-18 DIAGNOSIS — G4733 Obstructive sleep apnea (adult) (pediatric): Secondary | ICD-10-CM | POA: Diagnosis not present

## 2020-03-18 DIAGNOSIS — R69 Illness, unspecified: Secondary | ICD-10-CM | POA: Diagnosis not present

## 2020-03-18 DIAGNOSIS — E785 Hyperlipidemia, unspecified: Secondary | ICD-10-CM | POA: Diagnosis not present

## 2020-03-18 DIAGNOSIS — E039 Hypothyroidism, unspecified: Secondary | ICD-10-CM | POA: Diagnosis not present

## 2020-04-16 DIAGNOSIS — R69 Illness, unspecified: Secondary | ICD-10-CM | POA: Diagnosis not present

## 2020-06-02 DIAGNOSIS — H5203 Hypermetropia, bilateral: Secondary | ICD-10-CM | POA: Diagnosis not present

## 2020-06-23 DIAGNOSIS — Z0100 Encounter for examination of eyes and vision without abnormal findings: Secondary | ICD-10-CM | POA: Diagnosis not present

## 2020-06-26 DIAGNOSIS — H00025 Hordeolum internum left lower eyelid: Secondary | ICD-10-CM | POA: Diagnosis not present

## 2020-07-02 IMAGING — DX DG HAND COMPLETE 3+V*R*
3 series · 3 of 3 positions shown · non-contrast
Comparison: None.

CLINICAL DATA: Acute onset of right hand pain at the head of the
second metacarpal. Initial encounter.

EXAM:
RIGHT HAND - COMPLETE 3+ VIEW

[hand pa]
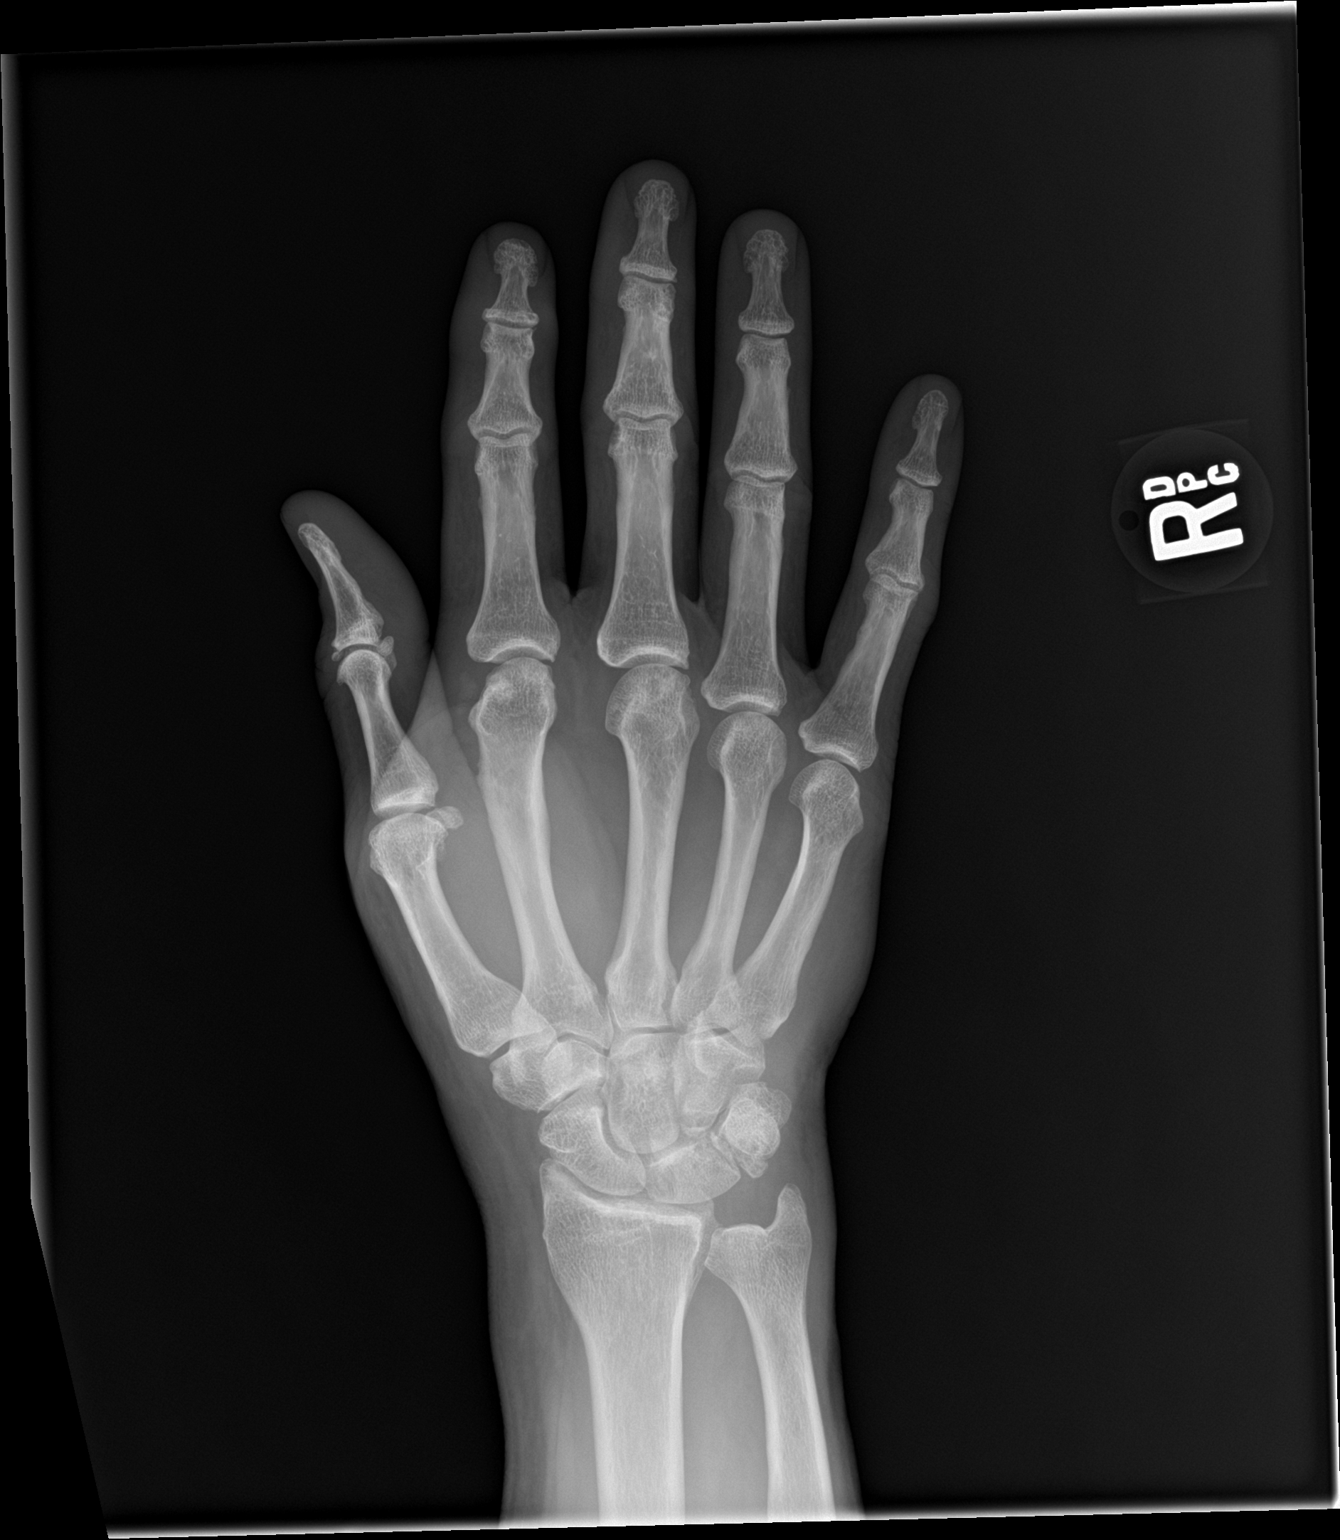

[hand obl]
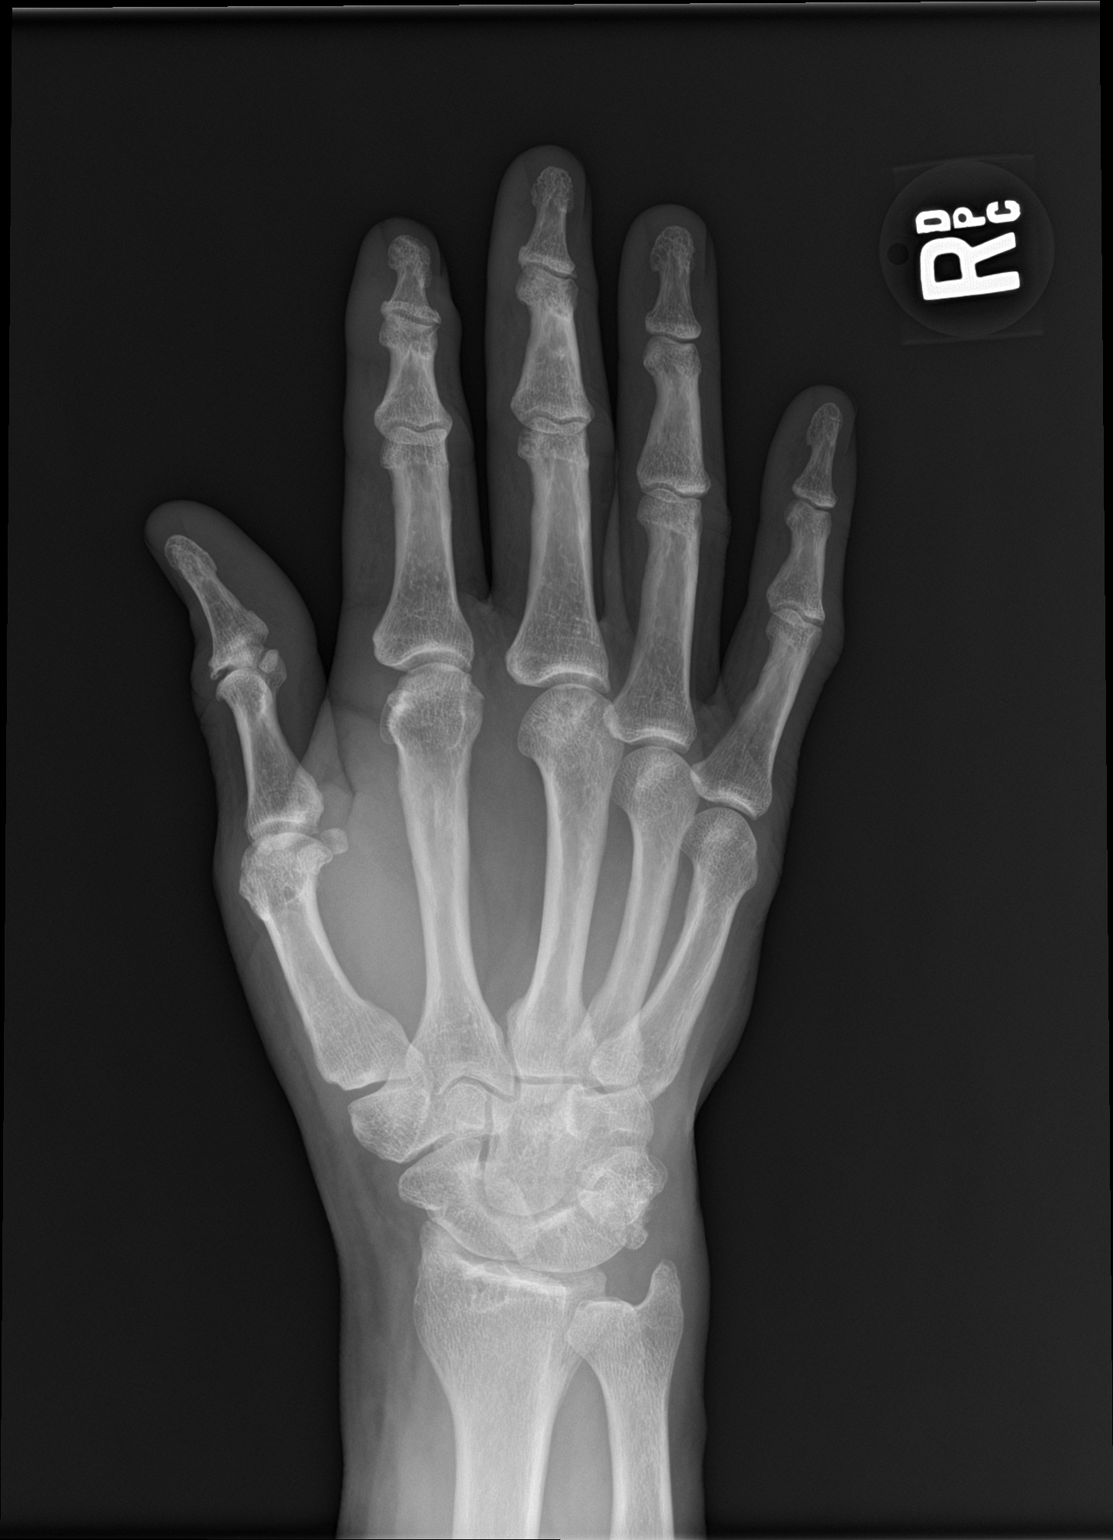

[hand lat]
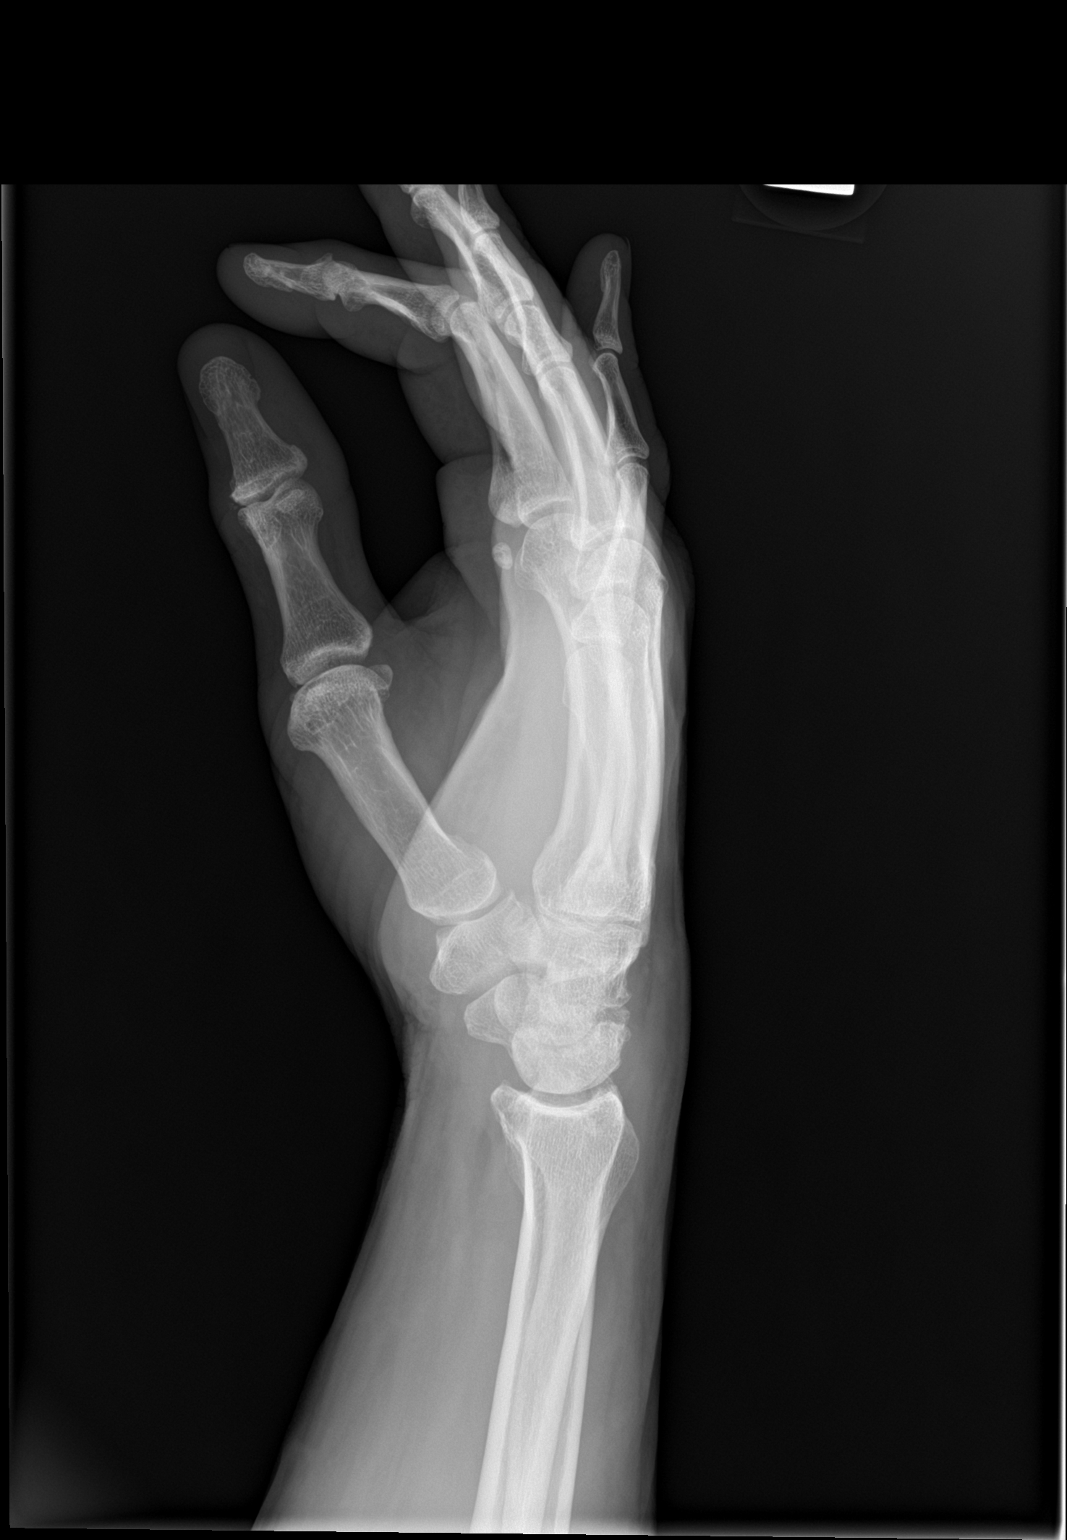

[3 of 3 positions shown; findings below may reference images not displayed]

FINDINGS: There is no evidence of fracture or dislocation. Slight deformity of
the distal second metatarsal appears to reflect remote traumatic
injury. No osseous erosions are seen. The joint spaces are
preserved. The carpal rows are intact, and demonstrate normal
alignment. Mild negative ulnar variance is noted.

Mild soft tissue swelling is noted about the wrist.
IMPRESSION: No evidence of fracture or dislocation.

## 2020-09-16 DIAGNOSIS — E039 Hypothyroidism, unspecified: Secondary | ICD-10-CM | POA: Diagnosis not present

## 2020-09-16 DIAGNOSIS — E785 Hyperlipidemia, unspecified: Secondary | ICD-10-CM | POA: Diagnosis not present

## 2020-09-16 DIAGNOSIS — Z0000 Encounter for general adult medical examination without abnormal findings: Secondary | ICD-10-CM | POA: Diagnosis not present

## 2020-09-16 DIAGNOSIS — Z125 Encounter for screening for malignant neoplasm of prostate: Secondary | ICD-10-CM | POA: Diagnosis not present

## 2020-09-16 DIAGNOSIS — I10 Essential (primary) hypertension: Secondary | ICD-10-CM | POA: Diagnosis not present

## 2020-09-23 DIAGNOSIS — Z23 Encounter for immunization: Secondary | ICD-10-CM | POA: Diagnosis not present

## 2020-09-23 DIAGNOSIS — E785 Hyperlipidemia, unspecified: Secondary | ICD-10-CM | POA: Diagnosis not present

## 2020-09-23 DIAGNOSIS — D696 Thrombocytopenia, unspecified: Secondary | ICD-10-CM | POA: Diagnosis not present

## 2020-09-23 DIAGNOSIS — G4733 Obstructive sleep apnea (adult) (pediatric): Secondary | ICD-10-CM | POA: Diagnosis not present

## 2020-09-23 DIAGNOSIS — I10 Essential (primary) hypertension: Secondary | ICD-10-CM | POA: Diagnosis not present

## 2020-09-23 DIAGNOSIS — E039 Hypothyroidism, unspecified: Secondary | ICD-10-CM | POA: Diagnosis not present

## 2020-09-23 DIAGNOSIS — Z0000 Encounter for general adult medical examination without abnormal findings: Secondary | ICD-10-CM | POA: Diagnosis not present

## 2020-09-23 DIAGNOSIS — E782 Mixed hyperlipidemia: Secondary | ICD-10-CM | POA: Diagnosis not present

## 2020-09-23 DIAGNOSIS — R69 Illness, unspecified: Secondary | ICD-10-CM | POA: Diagnosis not present

## 2020-09-23 DIAGNOSIS — K115 Sialolithiasis: Secondary | ICD-10-CM | POA: Diagnosis not present

## 2020-10-22 DIAGNOSIS — R69 Illness, unspecified: Secondary | ICD-10-CM | POA: Diagnosis not present

## 2021-03-17 DIAGNOSIS — E039 Hypothyroidism, unspecified: Secondary | ICD-10-CM | POA: Diagnosis not present

## 2021-03-17 DIAGNOSIS — Z125 Encounter for screening for malignant neoplasm of prostate: Secondary | ICD-10-CM | POA: Diagnosis not present

## 2021-03-17 DIAGNOSIS — I10 Essential (primary) hypertension: Secondary | ICD-10-CM | POA: Diagnosis not present

## 2021-03-17 DIAGNOSIS — E785 Hyperlipidemia, unspecified: Secondary | ICD-10-CM | POA: Diagnosis not present

## 2021-03-17 DIAGNOSIS — R7309 Other abnormal glucose: Secondary | ICD-10-CM | POA: Diagnosis not present

## 2021-03-25 DIAGNOSIS — Z125 Encounter for screening for malignant neoplasm of prostate: Secondary | ICD-10-CM | POA: Diagnosis not present

## 2021-03-25 DIAGNOSIS — M5459 Other low back pain: Secondary | ICD-10-CM | POA: Diagnosis not present

## 2021-03-25 DIAGNOSIS — I10 Essential (primary) hypertension: Secondary | ICD-10-CM | POA: Diagnosis not present

## 2021-03-25 DIAGNOSIS — E782 Mixed hyperlipidemia: Secondary | ICD-10-CM | POA: Diagnosis not present

## 2021-03-25 DIAGNOSIS — G8929 Other chronic pain: Secondary | ICD-10-CM | POA: Diagnosis not present

## 2021-03-25 DIAGNOSIS — E039 Hypothyroidism, unspecified: Secondary | ICD-10-CM | POA: Diagnosis not present

## 2021-06-03 DIAGNOSIS — D3131 Benign neoplasm of right choroid: Secondary | ICD-10-CM | POA: Diagnosis not present

## 2021-06-03 DIAGNOSIS — Z0100 Encounter for examination of eyes and vision without abnormal findings: Secondary | ICD-10-CM | POA: Diagnosis not present

## 2021-09-20 DIAGNOSIS — Z125 Encounter for screening for malignant neoplasm of prostate: Secondary | ICD-10-CM | POA: Diagnosis not present

## 2021-09-20 DIAGNOSIS — I10 Essential (primary) hypertension: Secondary | ICD-10-CM | POA: Diagnosis not present

## 2021-09-20 DIAGNOSIS — E039 Hypothyroidism, unspecified: Secondary | ICD-10-CM | POA: Diagnosis not present

## 2021-09-20 DIAGNOSIS — R739 Hyperglycemia, unspecified: Secondary | ICD-10-CM | POA: Diagnosis not present

## 2021-09-20 DIAGNOSIS — E782 Mixed hyperlipidemia: Secondary | ICD-10-CM | POA: Diagnosis not present

## 2021-09-27 DIAGNOSIS — E785 Hyperlipidemia, unspecified: Secondary | ICD-10-CM | POA: Diagnosis not present

## 2021-09-27 DIAGNOSIS — Z0000 Encounter for general adult medical examination without abnormal findings: Secondary | ICD-10-CM | POA: Diagnosis not present

## 2021-09-27 DIAGNOSIS — Z23 Encounter for immunization: Secondary | ICD-10-CM | POA: Diagnosis not present

## 2021-09-27 DIAGNOSIS — G8929 Other chronic pain: Secondary | ICD-10-CM | POA: Diagnosis not present

## 2021-09-27 DIAGNOSIS — E039 Hypothyroidism, unspecified: Secondary | ICD-10-CM | POA: Diagnosis not present

## 2021-09-27 DIAGNOSIS — I10 Essential (primary) hypertension: Secondary | ICD-10-CM | POA: Diagnosis not present

## 2022-02-26 ENCOUNTER — Encounter (HOSPITAL_COMMUNITY): Payer: Self-pay

## 2022-02-26 ENCOUNTER — Other Ambulatory Visit: Payer: Self-pay

## 2022-02-26 ENCOUNTER — Emergency Department (HOSPITAL_COMMUNITY)
Admission: EM | Admit: 2022-02-26 | Discharge: 2022-02-26 | Disposition: A | Discharge status: Home / Self Care | Payer: Medicare HMO | Attending: Emergency Medicine | Admitting: Emergency Medicine

## 2022-02-26 DIAGNOSIS — M25431 Effusion, right wrist: Secondary | ICD-10-CM

## 2022-02-26 DIAGNOSIS — Z79899 Other long term (current) drug therapy: Secondary | ICD-10-CM | POA: Diagnosis not present

## 2022-02-26 DIAGNOSIS — M10031 Idiopathic gout, right wrist: Secondary | ICD-10-CM

## 2022-02-26 DIAGNOSIS — M25531 Pain in right wrist: Secondary | ICD-10-CM | POA: Diagnosis not present

## 2022-02-26 DIAGNOSIS — M109 Gout, unspecified: Secondary | ICD-10-CM | POA: Diagnosis not present

## 2022-02-26 HISTORY — DX: Gout, unspecified: M10.9

## 2022-02-26 MED ORDER — PREDNISONE 50 MG PO TABS
60.0000 mg | ORAL_TABLET | Freq: Once | ORAL | Status: AC
  Administered 2022-02-26: 60 mg via ORAL
  Filled 2022-02-26: qty 1

## 2022-02-26 MED ORDER — HYDROMORPHONE HCL 1 MG/ML IJ SOLN
1.0000 mg | Freq: Once | INTRAMUSCULAR | Status: AC
  Administered 2022-02-26: 1 mg via INTRAMUSCULAR
  Filled 2022-02-26: qty 1

## 2022-02-26 MED ORDER — PREDNISONE 20 MG PO TABS: ORAL_TABLET | ORAL | 0 refills | Status: DC

## 2022-02-26 MED ORDER — TRAMADOL HCL 50 MG PO TABS: 50.0000 mg | ORAL_TABLET | Freq: Four times a day (QID) | ORAL | 0 refills | Status: DC | PRN

## 2022-02-26 MED ORDER — ACETAMINOPHEN 500 MG PO TABS
1000.0000 mg | ORAL_TABLET | Freq: Once | ORAL | Status: AC
  Administered 2022-02-26: 1000 mg via ORAL
  Filled 2022-02-26: qty 2

## 2022-02-26 NOTE — ED Provider Notes (Signed)
?[facility] ?Provider Note ? ? ?CSN: [id number] ?Arrival date & time: 02/26/22  0204 ? ?  ? ?History ? ?Chief Complaint  ?Patient presents with  ? Joint Swelling  ? ? ?Ricky Hoffman is a 71 y.o. male. ? ?Patient with hx gout, indicates Monday was cutting tree branches with limb cutter tool, and that afternoon/evening, onset right wrist pain. Symptoms acute onset, persistent, constant, worsening since then, now severe. Hx gout, feels somewhat similar. Denies other joint pain or swelling. Other than wrist pain does not feel ill or sick. No fever or chills. No nausea/vomiting. Skin intact. Right hand dom.  ? ?The history is provided by the patient and medical records.  ? ?  ? ?Home Medications ?Prior to Admission medications   ?Medication Sig Start Date End Date Taking? Authorizing Provider  ?pravastatin (PRAVACHOL) 10 MG tablet Take 10 mg by mouth daily.   Yes [provider]  ?colchicine 0.6 MG tablet Take 1 tablet (0.6 mg total) by mouth 2 (two) times daily. 07/12/18   Rancour, Annie Main, MD  ?cyclobenzaprine (FLEXERIL) 5 MG tablet Take 1-2 tablets (5-10 mg total) by mouth 3 (three) times daily as needed for muscle spasms. 07/14/15   Melony Overly, MD  ?DIAZEPAM PO Take 5 mg by mouth every 6 (six) hours as needed.    [provider]  ?doxycycline (VIBRAMYCIN) 100 MG capsule Take 1 capsule (100 mg total) by mouth 2 (two) times daily. 07/12/18   Rancour, Annie Main, MD  ?escitalopram (LEXAPRO) 10 MG tablet Take 10 mg by mouth daily.    [provider]  ?ESCITALOPRAM OXALATE PO Take by mouth.    [provider]  ?gabapentin (NEURONTIN) 100 MG capsule Take 100 mg by mouth daily.    [provider]  ?Gabapentin, Once-Daily, 300 MG TABS Take 900 mg by mouth daily before supper.    [provider]  ?L-Methylfolate-B6-B12 (METANX PO) Take by mouth every 12 (twelve) hours.    [provider]  ?l-methylfolate-B6-B12 (METANX) 3-35-2 MG TABS Take 1  tablet by mouth daily.    [provider]  ?levothyroxine (SYNTHROID, LEVOTHROID) 50 MCG tablet Take 50 mcg by mouth daily before breakfast.    [provider]  ?Levothyroxine Sodium 25 MCG CAPS Take 44 mcg by mouth daily before breakfast.    [provider]  ?naproxen (NAPROSYN) 500 MG tablet Take 1 tablet (500 mg total) by mouth 2 (two) times daily. 07/12/18   Ezequiel Essex, MD  ?NON FORMULARY 600 mg daily. Alpha Lipoic Acid    [provider]  ?tapentadol (NUCYNTA) 50 MG TABS tablet Take 150 mg by mouth every 4 (four) hours as needed.    [provider]  ?Tapentadol HCl (NUCYNTA PO) Take 500 mg by mouth every 4 (four) hours as needed.    [provider]  ?   ? ?Allergies    ?Patient has no known allergies.   ? ?Review of Systems   ?Review of Systems  ?Constitutional:  Negative for chills, diaphoresis and fever.  ?Musculoskeletal:   ?     Right wrist pain  ?Skin:  Negative for rash and wound.  ?Neurological:  Negative for weakness and numbness.  ? ?Physical Exam ?Updated Vital Signs ?BP (!) 107/96   Pulse 76   Temp 98.1 ?F (36.7 ?C) (Oral)   Resp (!) 24   Ht 1.803 m ('5\' 11"'$ )   Wt 105.7 kg   SpO2 97%   BMI 32.50 kg/m?  ?Physical  Exam ?Vitals and nursing note reviewed.  ?Constitutional:   ?   Appearance: Normal appearance. He is well-developed.  ?HENT:  ?   Head: Atraumatic.  ?   Nose: Nose normal.  ?   Mouth/Throat:  ?   Mouth: Mucous membranes are moist.  ?Eyes:  ?   General: No scleral icterus. ?   Conjunctiva/sclera: Conjunctivae normal.  ?Neck:  ?   Trachea: No tracheal deviation.  ?Cardiovascular:  ?   Rate and Rhythm: Normal rate and regular rhythm.  ?   Pulses: Normal pulses.  ?   Heart sounds: Normal heart sounds.  ?  No friction rub.  ?Pulmonary:  ?   Effort: Pulmonary effort is normal. No accessory muscle usage or respiratory distress.  ?   Breath sounds: Normal breath sounds.  ?Genitourinary: ?   Comments: No cva tenderness. ?Musculoskeletal:   ?   Cervical back: Neck supple.  ?   Comments: Mild swelling, and mild increased warmth to right wrist, skin very tender w palpation. Radial pulse 2+, normal cap refill in digits. No pain w passive rom digits. Compartments of forearm, soft, not tense. Swelling localized to wrist. Skin intact, no lesions or rash.   ?Skin: ?   General: Skin is warm and dry.  ?   Findings: No rash.  ?Neurological:  ?   Mental Status: He is alert.  ?   Comments: Alert, speech clear. Right hand nvi, with intact motor/sens fxn.   ?Psychiatric:     ?   Mood and Affect: Mood normal.  ? ? ?ED Results / Procedures / Treatments   ?Labs ?(all labs ordered are listed, but only abnormal results are displayed) ?Labs Reviewed - No data to display ? ?EKG ?None ? ?Radiology ?No results found. ? ?Procedures ?Procedures  ? ? ?Medications Ordered in ED ?Medications  ?predniSONE (DELTASONE) tablet 60 mg (has no administration in time range)  ?HYDROmorphone (DILAUDID) injection 1 mg (has no administration in time range)  ?acetaminophen (TYLENOL) tablet 1,000 mg (has no administration in time range)  ? ? ?ED Course/ Medical Decision Making/ A&P ?  ?                        ?Medical Decision Making ?Problems Addressed: ?Acute idiopathic gout of right wrist: acute illness or injury ?Pain and swelling of right wrist: acute illness or injury ? ?Amount and/or Complexity of Data Reviewed ?Independent Historian: spouse ?   Details: hx ?External Data Reviewed: labs and notes. ? ?Risk ?OTC drugs. ?Prescription drug management. ? ? ?No meds pta.  Prednisone po, acetaminophen po, dilaudid im.  ? ?Reviewed nursing notes and prior charts for additional history. External reports reviewed. In 2019, episode of severe right wrist pain - indicates resolved with outpatient tx gout. Additional hx: fam member.  ? ?Recheck pt, pain much improved from prior.  ? ?Pt currently appears stable for d/c.  ? ?Rec pcp f/u. ? ?Return precautions provided.  ? ? ? ? ? ? ? ? ? ?Final  Clinical Impression(s) / ED Diagnoses ?Final diagnoses:  ?None  ? ? ?Rx / DC Orders ?ED Discharge Orders   ? ? None  ? ?  ? ? ?  ?Lajean Saver, MD ?02/26/22 [phone] ? ?

## 2022-02-26 NOTE — ED Triage Notes (Signed)
Pt presents to ED from home with c/o right wrist pain and swelling that started Monday after cutting branches off of tree with limb cutters. Pt says swelling didn't start until yesterday, but pain has progressively gotten worse since Monday, pt has not seen PCP. Reports hx of gout.  ?

## 2022-02-26 NOTE — Discharge Instructions (Addendum)
It was our pleasure to provide your ER care today - we hope that you feel better. ? ?Take prednisone as prescribed. Take acetaminophen as need for pain. You may also take ultram as need for pain - no driving for the next 6 hours, or when taking ultram. Elevate wrist.  Icepack/coldpack to sore area.  ? ?Follow up with primary care doctor in the next 2-3 days if symptoms fail to improve. ? ?Return to ER if worse, new symptoms, fevers, spreading redness, intractable pain, or other concern.  ?

## 2022-03-21 DIAGNOSIS — E039 Hypothyroidism, unspecified: Secondary | ICD-10-CM | POA: Diagnosis not present

## 2022-03-21 DIAGNOSIS — E785 Hyperlipidemia, unspecified: Secondary | ICD-10-CM | POA: Diagnosis not present

## 2022-03-21 DIAGNOSIS — I10 Essential (primary) hypertension: Secondary | ICD-10-CM | POA: Diagnosis not present

## 2022-03-28 DIAGNOSIS — D696 Thrombocytopenia, unspecified: Secondary | ICD-10-CM | POA: Diagnosis not present

## 2022-03-28 DIAGNOSIS — M1A09X0 Idiopathic chronic gout, multiple sites, without tophus (tophi): Secondary | ICD-10-CM | POA: Diagnosis not present

## 2022-03-28 DIAGNOSIS — E039 Hypothyroidism, unspecified: Secondary | ICD-10-CM | POA: Diagnosis not present

## 2022-03-28 DIAGNOSIS — E782 Mixed hyperlipidemia: Secondary | ICD-10-CM | POA: Diagnosis not present

## 2022-03-28 DIAGNOSIS — Z125 Encounter for screening for malignant neoplasm of prostate: Secondary | ICD-10-CM | POA: Diagnosis not present

## 2022-03-28 DIAGNOSIS — I10 Essential (primary) hypertension: Secondary | ICD-10-CM | POA: Diagnosis not present

## 2022-09-22 DIAGNOSIS — D696 Thrombocytopenia, unspecified: Secondary | ICD-10-CM | POA: Diagnosis not present

## 2022-09-22 DIAGNOSIS — Z125 Encounter for screening for malignant neoplasm of prostate: Secondary | ICD-10-CM | POA: Diagnosis not present

## 2022-09-22 DIAGNOSIS — E782 Mixed hyperlipidemia: Secondary | ICD-10-CM | POA: Diagnosis not present

## 2022-09-22 DIAGNOSIS — M1A09X0 Idiopathic chronic gout, multiple sites, without tophus (tophi): Secondary | ICD-10-CM | POA: Diagnosis not present

## 2022-09-22 DIAGNOSIS — E039 Hypothyroidism, unspecified: Secondary | ICD-10-CM | POA: Diagnosis not present

## 2022-09-29 DIAGNOSIS — Z0000 Encounter for general adult medical examination without abnormal findings: Secondary | ICD-10-CM | POA: Diagnosis not present

## 2022-09-29 DIAGNOSIS — I10 Essential (primary) hypertension: Secondary | ICD-10-CM | POA: Diagnosis not present

## 2022-09-29 DIAGNOSIS — Z23 Encounter for immunization: Secondary | ICD-10-CM | POA: Diagnosis not present

## 2022-09-29 DIAGNOSIS — E039 Hypothyroidism, unspecified: Secondary | ICD-10-CM | POA: Diagnosis not present

## 2022-09-29 DIAGNOSIS — G8929 Other chronic pain: Secondary | ICD-10-CM | POA: Diagnosis not present

## 2022-09-29 DIAGNOSIS — E785 Hyperlipidemia, unspecified: Secondary | ICD-10-CM | POA: Diagnosis not present

## 2023-04-10 DIAGNOSIS — Z1211 Encounter for screening for malignant neoplasm of colon: Secondary | ICD-10-CM | POA: Diagnosis not present

## 2023-04-10 DIAGNOSIS — K640 First degree hemorrhoids: Secondary | ICD-10-CM | POA: Diagnosis not present

## 2023-04-10 DIAGNOSIS — R0689 Other abnormalities of breathing: Secondary | ICD-10-CM | POA: Diagnosis not present

## 2023-04-11 ENCOUNTER — Encounter: Payer: Self-pay | Admitting: Family Medicine

## 2023-04-11 ENCOUNTER — Ambulatory Visit (INDEPENDENT_AMBULATORY_CARE_PROVIDER_SITE_OTHER): Payer: Medicare HMO | Admitting: Family Medicine

## 2023-04-11 VITALS — BP 132/84 | HR 61 | Temp 97.8°F | Ht 71.0 in | Wt 229.8 lb

## 2023-04-11 DIAGNOSIS — Z125 Encounter for screening for malignant neoplasm of prostate: Secondary | ICD-10-CM | POA: Diagnosis not present

## 2023-04-11 DIAGNOSIS — G4733 Obstructive sleep apnea (adult) (pediatric): Secondary | ICD-10-CM

## 2023-04-11 DIAGNOSIS — E039 Hypothyroidism, unspecified: Secondary | ICD-10-CM | POA: Diagnosis not present

## 2023-04-11 DIAGNOSIS — E7800 Pure hypercholesterolemia, unspecified: Secondary | ICD-10-CM

## 2023-04-11 DIAGNOSIS — M1A9XX0 Chronic gout, unspecified, without tophus (tophi): Secondary | ICD-10-CM | POA: Diagnosis not present

## 2023-04-11 LAB — CBC WITH DIFFERENTIAL/PLATELET
Absolute Monocytes: 408 cells/uL (ref 200–950)
Basophils Relative: 1.4 %
Eosinophils Absolute: 199 cells/uL (ref 15–500)
Eosinophils Relative: 3.9 %
HCT: 46.1 % (ref 38.5–50.0)
Hemoglobin: 15.4 g/dL (ref 13.2–17.1)
Lymphs Abs: 1244 cells/uL (ref 850–3900)
MCHC: 33.4 g/dL (ref 32.0–36.0)
MCV: 92.2 fL (ref 80.0–100.0)
MPV: 10.7 fL (ref 7.5–12.5)
Monocytes Relative: 8 %
Neutrophils Relative %: 62.3 %
Platelets: 146 10*3/uL (ref 140–400)
RBC: 5 10*6/uL (ref 4.20–5.80)
RDW: 12.7 % (ref 11.0–15.0)

## 2023-04-11 MED ORDER — ROSUVASTATIN CALCIUM 20 MG PO TABS: 20.0000 mg | ORAL_TABLET | Freq: Every day | ORAL | 3 refills | Status: DC

## 2023-04-11 MED ORDER — TRIAMCINOLONE ACETONIDE 0.1 % EX CREA: 1.0000 | TOPICAL_CREAM | Freq: Two times a day (BID) | CUTANEOUS | 0 refills | Status: DC

## 2023-04-11 NOTE — Progress Notes (Signed)
Subjective:    Patient ID: Ricky Hoffman, male    DOB: Nov 18, 1951, 72 y.o.   MRN: [id number]  HPI Patient is a very pleasant 72 year old Caucasian gentleman here today to establish care.  Patient had very few medical problems until 2010.  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  He was trying to lift a wrestling mat when he suffered a severe back injury.  Per his report he herniated disc which essentially crushed his spinal cord.  He underwent 3 different surgeries and had resultant nerve damage after the back surgeries.  He was essentially confined to a wheelchair with weakness, unrelenting back pain, and numbness in his legs.  He states that when he tried to stand he would fall due to the weakness and nerve damage.  He battled depression.  He states that he was essentially confined to a wheelchair.  At 1 point he had not even been outside in months because of the pain in his back.  He was on disability.  He was taking Nucynta, Flexeril, gabapentin, and other pain medications.  He states that the left middle fog.  About 1 year ago, the patient made a conscious decision to wean himself off pain medication.  He is no longer taking any pain medicine.  The only medicine he is on is colchicine for gout flares that occur less than once a year, Crestor 20 mg a day for hyperlipidemia, levothyroxine 88 mcg daily for hypothyroidism.  He is still disabled.  He does not drink or smoke.  He is overdue for a colonoscopy however he is scheduled this himself.  He is also overdue for prostate cancer screening.  He has had Pneumovax 23 and Prevnar 20 per his report through his local pharmacy.  He states that he is also had the shingles vaccine.  Past Medical History:  Diagnosis Date   Anxiety    Arthritis    Gout    Hyperlipemia    Hypothyroidism    Neuromuscular disorder ([facility])    Sleep apnea    uses Cpap   Past Surgical History:  Procedure Laterality Date   BACK SURGERY     x 3   COLONOSCOPY  N/A 04/12/2013   Procedure: COLONOSCOPY;  Surgeon: Theda Belfast, MD;  Location: [facility];  Service: Endoscopy;  Laterality: N/A;   MULTIPLE TOOTH EXTRACTIONS     Current Outpatient Medications on File Prior to Visit  Medication Sig Dispense Refill   colchicine 0.6 MG tablet Take 1 tablet (0.6 mg total) by mouth 2 (two) times daily. 3 tablet 0   levothyroxine (SYNTHROID) 88 MCG tablet Take 88 mcg by mouth daily before breakfast.     levothyroxine (SYNTHROID, LEVOTHROID) 50 MCG tablet Take 50 mcg by mouth daily before breakfast. (Patient not taking: Reported on 04/11/2023)     NON FORMULARY 600 mg daily. Alpha Lipoic Acid (Patient not taking: Reported on 04/11/2023)     No current facility-administered medications on file prior to visit.   No Known Allergies Social History   Socioeconomic History   Marital status: Married    Spouse name: Not on file   Number of children: Not on file   Years of education: Not on file   Highest education level: Not on file  Occupational History   Not on file  Tobacco Use   Smoking status: Never   Smokeless tobacco: Never  Vaping Use   Vaping Use: Never used  Substance and Sexual Activity  Alcohol use: No   Drug use: No   Sexual activity: Not on file  Other Topics Concern   Not on file  Social History Narrative   Not on file   Social Determinants of Health   Financial Resource Strain: Not on file  Food Insecurity: Not on file  Transportation Needs: Not on file  Physical Activity: Not on file  Stress: Not on file  Social Connections: Not on file  Intimate Partner Violence: Not on file    Review of Systems  All other systems reviewed and are negative.      Objective:   Physical Exam Vitals reviewed.  Constitutional:      General: He is not in acute distress.    Appearance: Normal appearance. He is not ill-appearing or toxic-appearing.  Neck:     Vascular: No carotid bruit.  Cardiovascular:     Rate and Rhythm: Normal rate and  regular rhythm.     Pulses: Normal pulses.     Heart sounds: Normal heart sounds. No murmur heard.    No friction rub. No gallop.  Pulmonary:     Effort: Pulmonary effort is normal. No respiratory distress.     Breath sounds: Normal breath sounds. No stridor. No wheezing, rhonchi or rales.  Abdominal:     General: Bowel sounds are normal. There is no distension.     Palpations: Abdomen is soft.     Tenderness: There is no abdominal tenderness. There is no guarding.     Hernia: No hernia is present.  Musculoskeletal:     Lumbar back: Tenderness and bony tenderness present. Decreased range of motion.     Right lower leg: No edema.     Left lower leg: No edema.  Lymphadenopathy:     Cervical: No cervical adenopathy.  Neurological:     General: No focal deficit present.     Mental Status: He is alert and oriented to person, place, and time. Mental status is at baseline.     Cranial Nerves: No cranial nerve deficit.     Motor: No weakness.     Coordination: Coordination normal.     Gait: Gait normal.           Assessment & Plan:   Pure hypercholesterolemia - Plan: CBC with Differential/Platelet, COMPLETE METABOLIC PANEL WITH GFR, Lipid panel  Prostate cancer screening - Plan: PSA  Hypothyroidism, unspecified type - Plan: TSH  Chronic gout without tophus, unspecified cause, unspecified site - Plan: Uric acid  Obstructive sleep apnea syndrome Patient states that he is unable to lie flat on the table.  Therefore we did the exam sitting in exam chair.  Patient walked in today without using his cane however he does typically use a cane to ambulate around the home.  This is due to the persistent weakness in his legs.  I will check a CBC, CMP, and lipid panel today to monitor his renal function as well as his cholesterol.  I would like to see his LDL cholesterol less than 161.  I will screen for prostate cancer with a PSA.  I will check a TSH to ensure that he is on the correct dose of  levothyroxine.  I will also check a uric acid level.  If greater than 10, we may consider adding allopurinol to reduce the chance of tophus formation.  At the present time however he prefers just to take colchicine as needed given how infrequent he is having attacks.  His immunizations are up-to-date.

## 2023-04-12 LAB — LIPID PANEL
Cholesterol: 128 mg/dL (ref <200)
HDL: 37 mg/dL — ABNORMAL LOW (ref >=40)
LDL Cholesterol (Calc): 71 mg/dL (calc)
Non-HDL Cholesterol (Calc): 91 mg/dL (calc) (ref <130)
Total CHOL/HDL Ratio: 3.5 (calc) (ref <5.0)
Triglycerides: 112 mg/dL (ref <150)

## 2023-04-12 LAB — CBC WITH DIFFERENTIAL/PLATELET
Basophils Absolute: 71 cells/uL (ref 0–200)
MCH: 30.8 pg (ref 27.0–33.0)
Neutro Abs: 3177 cells/uL (ref 1500–7800)
Total Lymphocyte: 24.4 %
WBC: 5.1 10*3/uL (ref 3.8–10.8)

## 2023-04-12 LAB — COMPLETE METABOLIC PANEL WITH GFR
AG Ratio: 1.8 (calc) (ref 1.0–2.5)
ALT: 20 U/L (ref 9–46)
AST: 23 U/L (ref 10–35)
Albumin: 4.5 g/dL (ref 3.6–5.1)
Alkaline phosphatase (APISO): 53 U/L (ref 35–144)
BUN: 15 mg/dL (ref 7–25)
CO2: 30 mmol/L (ref 20–32)
Calcium: 9.6 mg/dL (ref 8.6–10.3)
Chloride: 99 mmol/L (ref 98–110)
Creat: 1.11 mg/dL (ref 0.70–1.35)
Globulin: 2.5 g/dL (calc) (ref 1.9–3.7)
Glucose, Bld: 85 mg/dL (ref 65–99)
Potassium: 4.8 mmol/L (ref 3.5–5.3)
Sodium: 136 mmol/L (ref 135–146)
Total Bilirubin: 0.8 mg/dL (ref 0.2–1.2)
Total Protein: 7 g/dL (ref 6.1–8.1)
eGFR: 72 mL/min/{1.73_m2} (ref >=60)

## 2023-04-12 LAB — TSH: TSH: 3.41 mIU/L (ref 0.40–4.50)

## 2023-04-12 LAB — PSA: PSA: 0.18 ng/mL (ref <=4.00)

## 2023-04-12 LAB — URIC ACID: Uric Acid, Serum: 6.3 mg/dL (ref 4.0–8.0)

## 2023-06-20 ENCOUNTER — Other Ambulatory Visit: Payer: Self-pay | Admitting: Gastroenterology

## 2023-07-27 ENCOUNTER — Ambulatory Visit (INDEPENDENT_AMBULATORY_CARE_PROVIDER_SITE_OTHER): Payer: Medicare HMO

## 2023-07-27 VITALS — Ht 71.0 in | Wt 229.0 lb

## 2023-07-27 DIAGNOSIS — Z0000 Encounter for general adult medical examination without abnormal findings: Secondary | ICD-10-CM | POA: Diagnosis not present

## 2023-07-27 NOTE — Progress Notes (Signed)
Subjective:   Ricky Hoffman is a 72 y.o. male who presents for an Initial Medicare Annual Wellness Visit.  Visit Complete: Virtual  I connected with  Ricky Hoffman [facility] on 07/27/23 by a audio enabled telemedicine application and verified that I am speaking with the correct person using two identifiers.  Patient Location: Home  Provider Location: Home Office  I discussed the limitations of evaluation and management by telemedicine. The patient expressed understanding and agreed to proceed.  Vital Signs: Because this visit was a virtual/telehealth visit, some criteria may be missing or patient reported. Any vitals not documented were not able to be obtained and vitals that have been documented are patient reported.   Review of Systems     Cardiac Risk Factors include: advanced age (>28men, >3 women);dyslipidemia;male gender     Objective:    Today's Vitals   07/27/23 1751  Weight: 229 lb (103.9 kg)  Height: 5\' 11"  (1.803 m)   Body mass index is 31.94 kg/m.     07/27/2023    5:54 PM 02/26/2022    2:28 AM 07/12/2018   12:04 AM 04/12/2013    8:20 AM  Advanced Directives  Does Patient Have a Medical Advance Directive? No No No Patient does not have advance directive  Would patient like information on creating a medical advance directive? Yes (MAU/Ambulatory/Procedural Areas - Information given) No - Patient declined No - Patient declined   Pre-existing out of facility DNR order (yellow form or pink MOST form)    No    Current Medications (verified) Outpatient Encounter Medications as of 07/27/2023  Medication Sig   colchicine 0.6 MG tablet Take 1 tablet (0.6 mg total) by mouth 2 (two) times daily.   levothyroxine (SYNTHROID) 88 MCG tablet Take 88 mcg by mouth daily before breakfast.   rosuvastatin (CRESTOR) 20 MG tablet Take 1 tablet (20 mg total) by mouth daily.   triamcinolone cream (KENALOG) 0.1 % Apply 1 Application topically 2 (two) times daily.   [DISCONTINUED]  levothyroxine (SYNTHROID, LEVOTHROID) 50 MCG tablet Take 50 mcg by mouth daily before breakfast. (Patient not taking: Reported on 04/11/2023)   [DISCONTINUED] NON FORMULARY 600 mg daily. Alpha Lipoic Acid (Patient not taking: Reported on 04/11/2023)   No facility-administered encounter medications on file as of 07/27/2023.    Allergies (verified) Patient has no known allergies.   History: Past Medical History:  Diagnosis Date   Anxiety    Arthritis    Gout    Hyperlipemia    Hypothyroidism    Neuromuscular disorder (HCC)    Sleep apnea    uses Cpap   Past Surgical History:  Procedure Laterality Date   BACK SURGERY     x 3   COLONOSCOPY N/A 04/12/2013   Procedure: COLONOSCOPY;  Surgeon: Theda Belfast, MD;  Location: [facility];  Service: Endoscopy;  Laterality: N/A;   MULTIPLE TOOTH EXTRACTIONS     No family history on file. Social History   Socioeconomic History   Marital status: Married    Spouse name: Not on file   Number of children: Not on file   Years of education: Not on file   Highest education level: Not on file  Occupational History   Not on file  Tobacco Use   Smoking status: Never   Smokeless tobacco: Never  Vaping Use   Vaping status: Never Used  Substance and Sexual Activity   Alcohol use: No   Drug use: No   Sexual activity: Not on file  Other Topics Concern   Not on file  Social History Narrative   Not on file   Social Determinants of Health   Financial Resource Strain: Low Risk  (07/27/2023)   Overall Financial Resource Strain (CARDIA)    Difficulty of Paying Living Expenses: Not hard at all  Food Insecurity: No Food Insecurity (07/27/2023)   Hunger Vital Sign    Worried About Running Out of Food in the Last Year: Never true    Ran Out of Food in the Last Year: Never true  Transportation Needs: No Transportation Needs (07/27/2023)   PRAPARE - Administrator, Civil Service (Medical): No    Lack of Transportation (Non-Medical): No   Physical Activity: Insufficiently Active (07/27/2023)   Exercise Vital Sign    Days of Exercise per Week: 3 days    Minutes of Exercise per Session: 30 min  Stress: No Stress Concern Present (07/27/2023)   [employer] of Occupational Health - Occupational Stress Questionnaire    Feeling of Stress : Not at all  Social Connections: Moderately Integrated (07/27/2023)   Social Connection and Isolation Panel [NHANES]    Frequency of Communication with Friends and Family: More than three times a week    Frequency of Social Gatherings with Friends and Family: Three times a week    Attends Religious Services: 1 to 4 times per year    Active Member of Clubs or Organizations: No    Attends Banker Meetings: Never    Marital Status: Married    Tobacco Counseling Counseling given: Not Answered   Clinical Intake:  Pre-visit preparation completed: Yes  Pain : No/denies pain     Diabetes: No  How often do you need to have someone help you when you read instructions, pamphlets, or other written materials from your doctor or pharmacy?: 1 - Never  Interpreter Needed?: No  Information entered by :: Kandis Fantasia LPN   Activities of Daily Living    07/27/2023    5:52 PM  In your present state of health, do you have any difficulty performing the following activities:  Hearing? 0  Vision? 0  Difficulty concentrating or making decisions? 0  Walking or climbing stairs? 0  Dressing or bathing? 0  Doing errands, shopping? 0  Preparing Food and eating ? N  Using the Toilet? N  In the past six months, have you accidently leaked urine? N  Do you have problems with loss of bowel control? N  Managing your Medications? N  Managing your Finances? N  Housekeeping or managing your Housekeeping? N    Patient Care Team: Donita Brooks, MD as PCP - General (Family Medicine)  Indicate any recent Medical Services you may have received from other than [facility] providers in the  past year (date may be approximate).     Assessment:   This is a routine wellness examination for Ricky Hoffman.  Hearing/Vision screen Hearing Screening - Comments:: Denies hearing difficulties   Vision Screening - Comments:: No vision problems; will schedule routine eye exam soon    Dietary issues and exercise activities discussed:     Goals Addressed             This Visit's Progress    Remain active and independent        Depression Screen    07/27/2023    5:53 PM  PHQ 2/9 Scores  PHQ - 2 Score 0    Fall Risk    07/27/2023    5:54  PM  Fall Risk   Falls in the past year? 0  Number falls in past yr: 0  Injury with Fall? 0  Risk for fall due to : No Fall Risks  Follow up Falls prevention discussed;Education provided;Falls evaluation completed    MEDICARE RISK AT HOME: Medicare Risk at Home Any stairs in or around the home?: No If so, are there any without handrails?: No Home free of loose throw rugs in walkways, pet beds, electrical cords, etc?: Yes Adequate lighting in your home to reduce risk of falls?: Yes Life alert?: No Use of a cane, walker or w/c?: No Grab bars in the bathroom?: Yes Shower chair or bench in shower?: No Elevated toilet seat or a handicapped toilet?: Yes  TIMED UP AND GO:  Was the test performed? No    Cognitive Function:        07/27/2023    5:55 PM  6CIT Screen  What Year? 0 points  What month? 0 points  What time? 0 points  Count back from 20 0 points  Months in reverse 0 points  Repeat phrase 0 points  Total Score 0 points    Immunizations Immunization History  Administered Date(s) Administered   Influenza,inj,Quad PF,6+ Mos 10/02/2017    TDAP status: Due, Education has been provided regarding the importance of this vaccine. Advised may receive this vaccine at local pharmacy or Health Dept. Aware to provide a copy of the vaccination record if obtained from local pharmacy or Health Dept. Verbalized acceptance and  understanding.  Flu Vaccine status: Due, Education has been provided regarding the importance of this vaccine. Advised may receive this vaccine at local pharmacy or Health Dept. Aware to provide a copy of the vaccination record if obtained from local pharmacy or Health Dept. Verbalized acceptance and understanding.  Pneumoccal vaccine status: Patient states that he has had with pharmacy  Covid-19 vaccine status: Information provided on how to obtain vaccines.   Qualifies for Shingles Vaccine? Yes   Zostavax completed Yes   Shingrix Completed?: No.    Education has been provided regarding the importance of this vaccine. Patient has been advised to call insurance company to determine out of pocket expense if they have not yet received this vaccine. Advised may also receive vaccine at local pharmacy or Health Dept. Verbalized acceptance and understanding.  Screening Tests Health Maintenance  Topic Date Due   Hepatitis C Screening  Never done   DTaP/Tdap/Td (1 - Tdap) Never done   Zoster Vaccines- Shingrix (1 of 2) Never done   Pneumonia Vaccine 12+ Years old (1 of 1 - PCV) Never done   COVID-19 Vaccine (1 - 2023-24 season) Never done   Colonoscopy  04/13/2023   INFLUENZA VACCINE  07/06/2023   Medicare Annual Wellness (AWV)  07/26/2024   HPV VACCINES  Aged Out    Health Maintenance  Health Maintenance Due  Topic Date Due   Hepatitis C Screening  Never done   DTaP/Tdap/Td (1 - Tdap) Never done   Zoster Vaccines- Shingrix (1 of 2) Never done   Pneumonia Vaccine 4+ Years old (1 of 1 - PCV) Never done   COVID-19 Vaccine (1 - 2023-24 season) Never done   Colonoscopy  04/13/2023   INFLUENZA VACCINE  07/06/2023    Colorectal cancer screening:  Patient is scheduled for 08/11/23 with Dr. Elnoria Howard  Lung Cancer Screening: (Low Dose CT Chest recommended if Age 54-80 years, 20 pack-year currently smoking OR have quit w/in 15years.) does not qualify.  Lung Cancer Screening Referral:  n/a  Additional Screening:  Hepatitis C Screening: does qualify  Vision Screening: Recommended annual ophthalmology exams for early detection of glaucoma and other disorders of the eye. Is the patient up to date with their annual eye exam?  No  Who is the provider or what is the name of the office in which the patient attends annual eye exams? none If pt is not established with a provider, would they like to be referred to a provider to establish care? No .   Dental Screening: Recommended annual dental exams for proper oral hygiene  Community Resource Referral / Chronic Care Management: CRR required this visit?  No   CCM required this visit?  No    Plan:     I have personally reviewed and noted the following in the patient's chart:   Medical and social history Use of alcohol, tobacco or illicit drugs  Current medications and supplements including opioid prescriptions. Patient is not currently taking opioid prescriptions. Functional ability and status Nutritional status Physical activity Advanced directives List of other physicians Hospitalizations, surgeries, and ER visits in previous 12 months Vitals Screenings to include cognitive, depression, and falls Referrals and appointments  In addition, I have reviewed and discussed with patient certain preventive protocols, quality metrics, and best practice recommendations. A written personalized care plan for preventive services as well as general preventive health recommendations were provided to patient.     Kandis Fantasia [place], [place]   03/13/8118   After Visit Summary: (Mail) Due to this being a telephonic visit, the after visit summary with patients personalized plan was offered to patient via mail   Nurse Notes: No concerns at this time

## 2023-07-27 NOTE — Patient Instructions (Signed)
Ricky Hoffman , Thank you for taking time to come for your Medicare Wellness Visit. I appreciate your ongoing commitment to your health goals. Please review the following plan we discussed and let me know if I can assist you in the future.   Referrals/Orders/Follow-Ups/Clinician Recommendations: Aim for 30 minutes of exercise or brisk walking, 6-8 glasses of water, and 5 servings of fruits and vegetables each day.  This is a list of the screening recommended for you and due dates:  Health Maintenance  Topic Date Due   Hepatitis C Screening  Never done   DTaP/Tdap/Td vaccine (1 - Tdap) Never done   Zoster (Shingles) Vaccine (1 of 2) Never done   Pneumonia Vaccine (1 of 1 - PCV) Never done   COVID-19 Vaccine (1 - 2023-24 season) Never done   Colon Cancer Screening  04/13/2023   Flu Shot  07/06/2023   Medicare Annual Wellness Visit  07/26/2024   HPV Vaccine  Aged Out    Advanced directives: (ACP Link)Information on Advanced Care Planning can be found at [facility] [place] Advance Health Care Directives Advance Health Care Directives ([email or web address]   Next Medicare Annual Wellness Visit scheduled for next year: Yes

## 2023-08-01 ENCOUNTER — Other Ambulatory Visit: Payer: Self-pay

## 2023-08-01 ENCOUNTER — Encounter (HOSPITAL_COMMUNITY): Payer: Self-pay | Admitting: Gastroenterology

## 2023-08-01 NOTE — Progress Notes (Addendum)
Pre op phone call completed. Instructed to bring CPAP mask and tubing.

## 2023-08-10 NOTE — Anesthesia Preprocedure Evaluation (Addendum)
Anesthesia Evaluation  Patient identified by MRN, date of birth, ID band Patient awake    Reviewed: Allergy & Precautions, NPO status , Patient's Chart, lab work & pertinent test results  History of Anesthesia Complications Negative for: history of anesthetic complications  Airway Mallampati: II  TM Distance: >3 FB Neck ROM: Full    Dental  (+) Partial Upper   Pulmonary sleep apnea and Continuous Positive Airway Pressure Ventilation    Pulmonary exam normal        Cardiovascular negative cardio ROS Normal cardiovascular exam     Neuro/Psych   Anxiety        GI/Hepatic negative GI ROS, Neg liver ROS,,,  Endo/Other  Hypothyroidism    Renal/GU negative Renal ROS     Musculoskeletal  (+) Arthritis ,    Abdominal   Peds  Hematology negative hematology ROS (+)   Anesthesia Other Findings Day of surgery medications reviewed with patient.  Reproductive/Obstetrics                              Anesthesia Physical Anesthesia Plan  ASA: 2  Anesthesia Plan: MAC   Post-op Pain Management: Minimal or no pain anticipated   Induction:   PONV Risk Score and Plan: 1 and Treatment may vary due to age or medical condition and Propofol infusion  Airway Management Planned: Natural Airway and Simple Face Mask  Additional Equipment: None  Intra-op Plan:   Post-operative Plan:   Informed Consent: I have reviewed the patients History and Physical, chart, labs and discussed the procedure including the risks, benefits and alternatives for the proposed anesthesia with the patient or authorized representative who has indicated his/her understanding and acceptance.       Plan Discussed with: CRNA  Anesthesia Plan Comments:         Anesthesia Quick Evaluation

## 2023-08-11 ENCOUNTER — Other Ambulatory Visit: Payer: Self-pay

## 2023-08-11 ENCOUNTER — Ambulatory Visit (HOSPITAL_COMMUNITY): Payer: Medicare HMO | Admitting: Anesthesiology

## 2023-08-11 ENCOUNTER — Ambulatory Visit (HOSPITAL_COMMUNITY)
Admission: RE | Admit: 2023-08-11 | Discharge: 2023-08-11 | Disposition: A | Discharge status: Home / Self Care | Payer: Medicare HMO | Attending: Gastroenterology | Admitting: Gastroenterology

## 2023-08-11 ENCOUNTER — Encounter (HOSPITAL_COMMUNITY): Payer: Self-pay | Admitting: Gastroenterology

## 2023-08-11 ENCOUNTER — Encounter (HOSPITAL_COMMUNITY)
Admission: RE | Disposition: A | Discharge status: Home / Self Care | Payer: Self-pay | Source: Home / Self Care | Attending: Gastroenterology

## 2023-08-11 DIAGNOSIS — Z1211 Encounter for screening for malignant neoplasm of colon: Secondary | ICD-10-CM | POA: Insufficient documentation

## 2023-08-11 DIAGNOSIS — K635 Polyp of colon: Secondary | ICD-10-CM | POA: Diagnosis not present

## 2023-08-11 DIAGNOSIS — G4730 Sleep apnea, unspecified: Secondary | ICD-10-CM | POA: Diagnosis not present

## 2023-08-11 DIAGNOSIS — K5730 Diverticulosis of large intestine without perforation or abscess without bleeding: Secondary | ICD-10-CM

## 2023-08-11 DIAGNOSIS — Z800 Family history of malignant neoplasm of digestive organs: Secondary | ICD-10-CM | POA: Insufficient documentation

## 2023-08-11 DIAGNOSIS — E039 Hypothyroidism, unspecified: Secondary | ICD-10-CM | POA: Diagnosis not present

## 2023-08-11 DIAGNOSIS — D123 Benign neoplasm of transverse colon: Secondary | ICD-10-CM | POA: Insufficient documentation

## 2023-08-11 HISTORY — PX: COLONOSCOPY WITH PROPOFOL: SHX5780

## 2023-08-11 HISTORY — PX: POLYPECTOMY: SHX5525

## 2023-08-11 SURGERY — COLONOSCOPY WITH PROPOFOL
Anesthesia: Monitor Anesthesia Care

## 2023-08-11 MED ORDER — PROPOFOL 10 MG/ML IV BOLUS
INTRAVENOUS | Status: AC
  Filled 2023-08-11: qty 20

## 2023-08-11 MED ORDER — PROPOFOL 1000 MG/100ML IV EMUL
INTRAVENOUS | Status: AC
  Filled 2023-08-11: qty 100

## 2023-08-11 MED ORDER — ACETAMINOPHEN 500 MG PO TABS
ORAL_TABLET | ORAL | Status: AC
  Filled 2023-08-11: qty 2

## 2023-08-11 MED ORDER — LIDOCAINE HCL (CARDIAC) PF 100 MG/5ML IV SOSY
PREFILLED_SYRINGE | INTRAVENOUS | Status: DC | PRN
Start: 2023-08-11 — End: 2023-08-11
  Administered 2023-08-11: 80 mg via INTRAVENOUS

## 2023-08-11 MED ORDER — ACETAMINOPHEN 500 MG PO TABS
1000.0000 mg | ORAL_TABLET | Freq: Once | ORAL | Status: AC | PRN
  Administered 2023-08-11: 1000 mg via ORAL

## 2023-08-11 MED ORDER — SODIUM CHLORIDE 0.9 % IV SOLN: INTRAVENOUS | Status: DC

## 2023-08-11 MED ORDER — EPHEDRINE SULFATE-NACL 50-0.9 MG/10ML-% IV SOSY
PREFILLED_SYRINGE | INTRAVENOUS | Status: DC | PRN
Start: 2023-08-11 — End: 2023-08-11
  Administered 2023-08-11: 10 mg via INTRAVENOUS

## 2023-08-11 MED ORDER — PROPOFOL 10 MG/ML IV BOLUS
INTRAVENOUS | Status: DC | PRN
  Administered 2023-08-11: 100 mg via INTRAVENOUS

## 2023-08-11 MED ORDER — PROPOFOL 500 MG/50ML IV EMUL
INTRAVENOUS | Status: DC | PRN
  Administered 2023-08-11: 150 ug/kg/min via INTRAVENOUS

## 2023-08-11 MED ORDER — LACTATED RINGERS IV SOLN: INTRAVENOUS | Status: DC

## 2023-08-11 SURGICAL SUPPLY — 22 items
ELECT REM PT RETURN 9FT ADLT (ELECTROSURGICAL)
ELECTRODE REM PT RTRN 9FT ADLT (ELECTROSURGICAL) IMPLANT
FCP BXJMBJMB 240X2.8X (CUTTING FORCEPS)
FLOOR PAD 36X40 (MISCELLANEOUS) ×2
FORCEPS BIOP RAD 4 LRG CAP 4 (CUTTING FORCEPS) IMPLANT
FORCEPS BIOP RJ4 240 W/NDL (CUTTING FORCEPS)
FORCEPS BXJMBJMB 240X2.8X (CUTTING FORCEPS) IMPLANT
INJECTOR/SNARE I SNARE (MISCELLANEOUS) IMPLANT
LUBRICANT JELLY 4.5OZ STERILE (MISCELLANEOUS) IMPLANT
MANIFOLD NEPTUNE II (INSTRUMENTS) IMPLANT
NDL SCLEROTHERAPY 25GX240 (NEEDLE) IMPLANT
NEEDLE SCLEROTHERAPY 25GX240 (NEEDLE)
PAD FLOOR 36X40 (MISCELLANEOUS) ×3 IMPLANT
PROBE APC STR FIRE (PROBE) IMPLANT
PROBE INJECTION GOLD (MISCELLANEOUS)
PROBE INJECTION GOLD 7FR (MISCELLANEOUS) IMPLANT
SNARE ROTATE MED OVAL 20MM (MISCELLANEOUS) IMPLANT
SYR 50ML LL SCALE MARK (SYRINGE) IMPLANT
TRAP SPECIMEN MUCOUS 40CC (MISCELLANEOUS) IMPLANT
TUBING ENDO SMARTCAP PENTAX (MISCELLANEOUS) IMPLANT
TUBING IRRIGATION ENDOGATOR (MISCELLANEOUS) ×3 IMPLANT
WATER STERILE IRR 1000ML POUR (IV SOLUTION) IMPLANT

## 2023-08-11 NOTE — Anesthesia Postprocedure Evaluation (Signed)
Anesthesia Post Note  Patient: Ricky Hoffman [facility]  Procedure(s) Performed: COLONOSCOPY WITH PROPOFOL POLYPECTOMY     Patient location during evaluation: PACU Anesthesia Type: MAC Level of consciousness: awake and alert Pain management: pain level controlled Vital Signs Assessment: post-procedure vital signs reviewed and stable Respiratory status: spontaneous breathing, nonlabored ventilation and respiratory function stable Cardiovascular status: blood pressure returned to baseline Postop Assessment: no apparent nausea or vomiting Anesthetic complications: no   No notable events documented.  Last Vitals:  Vitals:   08/11/23 1040 08/11/23 1050  BP: 125/77 136/89  Pulse: 63 (!) 57  Resp: 16 19  Temp:    SpO2: 100% 99%    Last Pain:  Vitals:   08/11/23 1050  TempSrc:   PainSc: 5                  Shanda Howells

## 2023-08-11 NOTE — Addendum Note (Signed)
Addendum  created 08/11/23 1434 by Oletha Cruel, CRNA   Clinical Note Signed

## 2023-08-11 NOTE — Discharge Instructions (Signed)
YOU HAD AN ENDOSCOPIC PROCEDURE TODAY: Refer to the procedure report and other information in the discharge instructions given to you for any specific questions about what was found during the examination. If this information does not answer your questions, please call [facility] at [phone] to clarify.   YOU SHOULD EXPECT: Some feelings of bloating in the abdomen. Passage of more gas than usual. Walking can help get rid of the air that was put into your GI tract during the procedure and reduce the bloating. If you had a lower endoscopy (such as a colonoscopy or flexible sigmoidoscopy) you may notice spotting of blood in your stool or on the toilet paper. Some abdominal soreness may be present for a day or two, also.  DIET: Your first meal following the procedure should be a light meal and then it is ok to progress to your normal diet. A half-sandwich or bowl of soup is an example of a good first meal. Heavy or fried foods are harder to digest and may make you feel nauseous or bloated. Drink plenty of fluids but you should avoid alcoholic beverages for 24 hours.  ACTIVITY: Your care partner should take you home directly after the procedure. You should plan to take it easy, moving slowly for the rest of the day. You can resume normal activity the day after the procedure however YOU SHOULD NOT DRIVE, use power tools, machinery or perform tasks that involve climbing or major physical exertion for 24 hours (because of the sedation medicines used during the test).   SYMPTOMS TO REPORT IMMEDIATELY: A gastroenterologist can be reached at any hour. Please call [phone]  for any of the following symptoms:  Following lower endoscopy (colonoscopy, flexible sigmoidoscopy) Excessive amounts of blood in the stool  Significant tenderness, worsening of abdominal pains  Swelling of the abdomen that is new, acute  Fever of 100 or higher    FOLLOW UP:  If any biopsies were taken you will be  contacted by phone or by letter within the next 1-3 weeks. Call [phone]  if you have not heard about the biopsies in 3 weeks.  Please also call with any specific questions about appointments or follow up tests.  

## 2023-08-11 NOTE — Op Note (Signed)
[facility] Patient Name: Ricky Hoffman Procedure Date: 08/11/2023 MRN: [id number] Attending MD: Jeani Hawking , MD, [id number] Date of Birth: 02-10-1951 CSN: [id number] Age: 72 Admit Type: Outpatient Procedure:                Colonoscopy Indications:              Screening for colorectal malignant neoplasm Providers:                Jeani Hawking, MD, Doristine Mango, RN, Cephus Richer, RN, Jacquelyn "Jaci" Clelia Croft, RN, Harrington Challenger, Technician Referring MD:              Medicines:                Propofol per Anesthesia Complications:            No immediate complications. Estimated Blood Loss:     Estimated blood loss: none. Procedure:                Pre-Anesthesia Assessment:                           - Prior to the procedure, a History and Physical                            was performed, and patient medications and                            allergies were reviewed. The patient's tolerance of                            previous anesthesia was also reviewed. The risks                            and benefits of the procedure and the sedation                            options and risks were discussed with the patient.                            All questions were answered, and informed consent                            was obtained. Prior Anticoagulants: The patient has                            taken no anticoagulant or antiplatelet agents. ASA                            Grade Assessment: III - A patient with severe  systemic disease. After reviewing the risks and                            benefits, the patient was deemed in satisfactory                            condition to undergo the procedure.                           - Sedation was administered by an anesthesia                            professional. Deep sedation was attained.                           After obtaining  informed consent, the colonoscope                            was passed under direct vision. Throughout the                            procedure, the patient's blood pressure, pulse, and                            oxygen saturations were monitored continuously. The                            CF-HQ190L ([id number]) Olympus colonoscope was                            introduced through the anus and advanced to the the                            cecum, identified by appendiceal orifice and                            ileocecal valve. The colonoscopy was performed                            without difficulty. The patient tolerated the                            procedure well. The quality of the bowel                            preparation was evaluated using the BBPS [facility]                            Bowel Preparation Scale) with scores of: Right                            Colon = 3, Transverse Colon = 3 and Left Colon = 3                            (  entire mucosa seen well with no residual staining,                            small fragments of stool or opaque liquid). The                            total BBPS score equals 9. The ileocecal valve,                            appendiceal orifice, and rectum were photographed. Scope In: 9:48:18 AM Scope Out: 10:06:27 AM Scope Withdrawal Time: 0 hours 13 minutes 35 seconds  Total Procedure Duration: 0 hours 18 minutes 9 seconds  Findings:      A 3 mm polyp was found in the transverse colon. The polyp was sessile.       The polyp was removed with a cold snare. Resection and retrieval were       complete.      A few small-mouthed diverticula were found in the sigmoid colon. Impression:               - One 3 mm polyp in the transverse colon, removed                            with a cold snare. Resected and retrieved.                           - Diverticulosis in the sigmoid colon. Moderate Sedation:      Not Applicable - Patient had care per  Anesthesia. Recommendation:           - Patient has a contact number available for                            emergencies. The signs and symptoms of potential                            delayed complications were discussed with the                            patient. Return to normal activities tomorrow.                            Written discharge instructions were provided to the                            patient.                           - Resume previous diet.                           - Continue present medications.                           - Await pathology results.                           -  Repeat colonoscopy is not recommended for                            surveillance. Procedure Code(s):        --- Professional ---                           [phone], Colonoscopy, flexible; with removal of                            tumor(s), polyp(s), or other lesion(s) by snare                            technique Diagnosis Code(s):        --- Professional ---                           D12.3, Benign neoplasm of transverse colon (hepatic                            flexure or splenic flexure)                           Z12.11, Encounter for screening for malignant                            neoplasm of colon                           K57.30, Diverticulosis of large intestine without                            perforation or abscess without bleeding CPT copyright 2022 American Medical Association. All rights reserved. The codes documented in this report are preliminary and upon coder review may  be revised to meet current compliance requirements. Jeani Hawking, MD Jeani Hawking, MD 08/11/2023 10:12:09 AM This report has been signed electronically. Number of Addenda: 0

## 2023-08-11 NOTE — Transfer of Care (Addendum)
Immediate Anesthesia Transfer of Care Note  Patient: [name] [facility]  Procedure(s) Performed: COLONOSCOPY WITH PROPOFOL POLYPECTOMY  Patient Location: PACU  Anesthesia Type:MAC  Level of Consciousness: drowsy and patient cooperative  Airway & Oxygen Therapy: Patient Spontanous Breathing and Patient connected to face mask oxygen  Post-op Assessment: Report given to RN and Post -op Vital signs reviewed and stable  Post vital signs: Reviewed and stable  Last Vitals:  Vitals Value Taken Time  BP 99/53 08/11/23 1013  Temp 36.1 08/11/23   1013  Pulse 60 08/11/23 1014  Resp 14 08/11/23 1014  SpO2 100 % 08/11/23 1014  Vitals shown include unfiled device data.  Last Pain:  Vitals:   08/11/23 1013  TempSrc:   PainSc: Asleep         Complications: No notable events documented.

## 2023-08-11 NOTE — H&P (Signed)
Ricky Hoffman HPI: At this time the patient denies any problems with nausea, vomiting, fevers, chills, abdominal pain, diarrhea, constipation, hematochezia, melena, GERD, or dysphagia. The patient's maternal grandmother died from colon cancer in her 63's.  No complaints of chest pain, SOB, MI, or sleep apnea.  Past Medical History:  Diagnosis Date   Anxiety    Arthritis    Gout    Hyperlipemia    Hypothyroidism    Neuromuscular disorder (HCC)    Sleep apnea    uses Cpap    Past Surgical History:  Procedure Laterality Date   BACK SURGERY     x 3   COLONOSCOPY N/A 04/12/2013   Procedure: COLONOSCOPY;  Surgeon: Theda Belfast, MD;  Location: [facility];  Service: Endoscopy;  Laterality: N/A;   MULTIPLE TOOTH EXTRACTIONS      History reviewed. No pertinent family history.  Social History:  reports that he has never smoked. He has never used smokeless tobacco. He reports that he does not drink alcohol and does not use drugs.  Allergies: No Known Allergies  Medications: Scheduled: Continuous:  sodium chloride     lactated ringers 10 mL/hr at 08/11/23 0916    No results found for this or any previous visit (from the past 24 hour(s)).   No results found.  ROS:  As stated above in the HPI otherwise negative.  Blood pressure (!) 151/75, pulse 61, temperature 97.8 F (36.6 C), temperature source Temporal, resp. rate 16, height 5\' 11"  (1.803 m), weight 104.3 kg, SpO2 100%.    PE: Gen: NAD, Alert and Oriented HEENT:  [place]/AT, EOMI Neck: Supple, no LAD Lungs: CTA Bilaterally CV: RRR without M/G/R ABD: Soft, NTND, +BS Ext: No C/C/E  Assessment/Plan: 1) Screening colonoscopy.  Ricky Hoffman D 08/11/2023, 9:27 AM

## 2023-08-14 ENCOUNTER — Encounter (HOSPITAL_COMMUNITY): Payer: Self-pay | Admitting: Gastroenterology

## 2023-08-14 LAB — SURGICAL PATHOLOGY

## 2023-09-27 ENCOUNTER — Ambulatory Visit: Payer: Medicare HMO

## 2023-09-27 DIAGNOSIS — Z23 Encounter for immunization: Secondary | ICD-10-CM

## 2023-09-27 NOTE — Progress Notes (Signed)
Pt came in for high dose flu vaccine. Vaccine given on the l-upper arm. Pt tol injection well, no c/o. Pt left ambulatory w/no c/o.

## 2023-10-12 ENCOUNTER — Encounter: Payer: Self-pay | Admitting: Family Medicine

## 2023-10-12 ENCOUNTER — Ambulatory Visit: Payer: Medicare HMO | Admitting: Family Medicine

## 2023-10-12 VITALS — BP 124/82 | HR 70 | Temp 98.2°F | Ht 71.0 in | Wt 237.5 lb

## 2023-10-12 DIAGNOSIS — E039 Hypothyroidism, unspecified: Secondary | ICD-10-CM

## 2023-10-12 DIAGNOSIS — E7800 Pure hypercholesterolemia, unspecified: Secondary | ICD-10-CM | POA: Diagnosis not present

## 2023-10-12 DIAGNOSIS — Z1159 Encounter for screening for other viral diseases: Secondary | ICD-10-CM

## 2023-10-12 NOTE — Assessment & Plan Note (Signed)
Direct LDL today as he is non-fasting. Continue Pravastatin 20mg  daily. Your labs showed elevated cholesterol. I recommend consuming a heart healthy diet such as Mediterranean diet or DASH diet with whole grains, fruits, vegetable, fish, lean meats, nuts, and olive oil. Limit sweets and processed foods. I also encourage moderate intensity exercise 150 minutes weekly. This is 3-5 times weekly for 30-50 minutes each session. Goal should be pace of 3 miles/hours, or walking 1.5 miles in 30 minutes. Follow up in 6 months with fasting labs 1 week prior.

## 2023-10-12 NOTE — Progress Notes (Signed)
Subjective:  HPI: Ricky Hoffman is a 72 y.o. male presenting on 10/12/2023 for Follow-up (Doing well. Here for 6 month follow up.)   HPI Patient is in today for follow-up for his chronic conditions and medication refills. PMH includes gout, anxiety, arthritis, hyperlipidemia, hypothyroidism, and sleep apnea. No new concerns today. His anxiety is well controlled. He has not had a gout flare up in many months. Continues heart healthy diet and exercise is limited by his chronic back pain, is taking Pravastatin 20mg  daily, will obtain non-fasting labs today. Hypothyroidism is stable and he is without symptoms, has not had a dose adjustment in over a year. Takes 4 days weekly. He had a colonoscopy completed last month without abnormalities. He reports his vaccines are UTD, he had pneumonia, shingles, and TDaP with his prior PCP.  HYPERLIPIDEMIA Hyperlipidemia status: excellent compliance Satisfied with current treatment?  yes Side effects:  no Medication compliance: excellent compliance Past cholesterol meds: pravastatin (pravachol) Supplements: none Aspirin:  no The ASCVD Risk score (Arnett DK, et al., 2019) failed to calculate for the following reasons:   The valid total cholesterol range is 130 to 320 mg/dL Chest pain:  no Coronary artery disease:  no Family history CAD:   Family history early CAD:    HYPOTHYROIDISM Thyroid control status:controlled Satisfied with current treatment? yes Medication side effects: no Medication compliance: excellent compliance Etiology of hypothyroidism:  Recent dose adjustment:no Fatigue: no Cold intolerance: no Heat intolerance: no Weight gain: no Weight loss: no Constipation: no Diarrhea/loose stools: no Palpitations: no Lower extremity edema: no Anxiety/depressed mood: no   Review of Systems  All other systems reviewed and are negative.   Relevant past medical history reviewed and updated as indicated.   Past Medical  History:  Diagnosis Date   Anxiety    Arthritis    Gout    Hyperlipemia    Hypothyroidism    Neuromuscular disorder (HCC)    Sleep apnea    uses Cpap     Past Surgical History:  Procedure Laterality Date   BACK SURGERY     x 3   COLONOSCOPY N/A 04/12/2013   Procedure: COLONOSCOPY;  Surgeon: Theda Belfast, MD;  [facility]: [facility];  Service: Endoscopy;  Laterality: N/A;   COLONOSCOPY WITH PROPOFOL N/A 08/11/2023   Procedure: COLONOSCOPY WITH PROPOFOL;  Surgeon: Jeani Hawking, MD;  Location: [facility];  Service: Gastroenterology;  Laterality: N/A;   MULTIPLE TOOTH EXTRACTIONS     POLYPECTOMY  08/11/2023   Procedure: POLYPECTOMY;  Surgeon: Jeani Hawking, MD;  [facility]: [facility];  Service: Gastroenterology;;    Allergies and medications reviewed and updated.   Current Outpatient Medications:    colchicine 0.6 MG tablet, Take 1 tablet (0.6 mg total) by mouth 2 (two) times daily., Disp: 3 tablet, Rfl: 0   levothyroxine (SYNTHROID) 88 MCG tablet, Take 88 mcg by mouth daily before breakfast., Disp: , Rfl:    pravastatin (PRAVACHOL) 20 MG tablet, Take 20 mg by mouth daily., Disp: , Rfl:    triamcinolone cream (KENALOG) 0.1 %, Apply 1 Application topically 2 (two) times daily., Disp: 30 g, Rfl: 0  No Known Allergies  Objective:   BP 124/82 (BP Location: Left Arm)   Pulse 70   Temp 98.2 F (36.8 C)   Ht 5\' 11"  (1.803 m)   Wt 237 lb 8 oz (107.7 kg)   SpO2 97%   BMI 33.12 kg/m      10/12/2023    7:56 AM 08/11/2023  11:00 AM 08/11/2023   10:50 AM  Vitals with BMI  Height 5\' 11"     Weight 237 lbs 8 oz    BMI 33.14    Systolic 124 157 161  Diastolic 82 80 89  Pulse 70 60 57     Physical Exam Vitals and nursing note reviewed.  Constitutional:      Appearance: Normal appearance. He is normal weight.  HENT:     Head: Normocephalic and atraumatic.  Neck:     Thyroid: No thyroid mass, thyromegaly or thyroid tenderness.  Cardiovascular:     Rate and Rhythm: Normal  rate and regular rhythm.     Pulses: Normal pulses.     Heart sounds: Normal heart sounds.  Pulmonary:     Effort: Pulmonary effort is normal.     Breath sounds: Normal breath sounds.  Musculoskeletal:     Cervical back: Full passive range of motion without pain, normal range of motion and neck supple.  Skin:    General: Skin is warm and dry.     Capillary Refill: Capillary refill takes less than 2 seconds.  Neurological:     General: No focal deficit present.     Mental Status: He is alert and oriented to person, place, and time. Mental status is at baseline.  Psychiatric:        Mood and Affect: Mood normal.        Behavior: Behavior normal.        Thought Content: Thought content normal.        Judgment: Judgment normal.     Assessment & Plan:  Pure hypercholesterolemia Assessment & Plan: Direct LDL today as he is non-fasting. Continue Pravastatin 20mg  daily. Your labs showed elevated cholesterol. I recommend consuming a heart healthy diet such as Mediterranean diet or DASH diet with whole grains, fruits, vegetable, fish, lean meats, nuts, and olive oil. Limit sweets and processed foods. I also encourage moderate intensity exercise 150 minutes weekly. This is 3-5 times weekly for 30-50 minutes each session. Goal should be pace of 3 miles/hours, or walking 1.5 miles in 30 minutes. Follow up in 6 months with fasting labs 1 week prior.  Orders: -     COMPLETE METABOLIC PANEL WITH GFR -     LDL cholesterol, direct  Hypothyroidism, unspecified type Assessment & Plan: TSH drawn today. Will titrate as indicated and provide refills for Synthroid. Follow up in 6 months or sooner if needed.  Orders: -     COMPLETE METABOLIC PANEL WITH GFR -     Thyroid Panel With TSH  Need for hepatitis C screening test -     Hepatitis C antibody     Follow up plan: Return in about 6 months (around 04/10/2024) for Dr Tanya Nones chronic follow-up with labs 1 week prior.  Park Meo,  FNP

## 2023-10-12 NOTE — Assessment & Plan Note (Signed)
TSH drawn today. Will titrate as indicated and provide refills for Synthroid. Follow up in 6 months or sooner if needed.

## 2023-10-13 LAB — COMPLETE METABOLIC PANEL WITH GFR
AG Ratio: 1.7 (calc) (ref 1.0–2.5)
ALT: 16 U/L (ref 9–46)
AST: 19 U/L (ref 10–35)
Albumin: 4.3 g/dL (ref 3.6–5.1)
Alkaline phosphatase (APISO): 52 U/L (ref 35–144)
BUN: 16 mg/dL (ref 7–25)
CO2: 32 mmol/L (ref 20–32)
Calcium: 9.6 mg/dL (ref 8.6–10.3)
Chloride: 100 mmol/L (ref 98–110)
Creat: 1.08 mg/dL (ref 0.70–1.28)
Globulin: 2.5 g/dL (ref 1.9–3.7)
Glucose, Bld: 84 mg/dL (ref 65–99)
Potassium: 4.5 mmol/L (ref 3.5–5.3)
Sodium: 138 mmol/L (ref 135–146)
Total Bilirubin: 0.5 mg/dL (ref 0.2–1.2)
Total Protein: 6.8 g/dL (ref 6.1–8.1)
eGFR: 74 mL/min/{1.73_m2} (ref >=60)

## 2023-10-13 LAB — LDL CHOLESTEROL, DIRECT: Direct LDL: 72 mg/dL (ref <100)

## 2023-10-13 LAB — THYROID PANEL WITH TSH
Free Thyroxine Index: 1 — ABNORMAL LOW (ref 1.4–3.8)
T3 Uptake: 29 % (ref 22–35)
T4, Total: 3.3 ug/dL — ABNORMAL LOW (ref 4.9–10.5)
TSH: 8.69 m[IU]/L — ABNORMAL HIGH (ref 0.40–4.50)

## 2023-10-13 LAB — HEPATITIS C ANTIBODY: Hepatitis C Ab: NONREACTIVE

## 2023-10-16 ENCOUNTER — Other Ambulatory Visit: Payer: Self-pay

## 2023-10-16 ENCOUNTER — Other Ambulatory Visit: Payer: Self-pay | Admitting: Family Medicine

## 2023-10-16 DIAGNOSIS — E039 Hypothyroidism, unspecified: Secondary | ICD-10-CM

## 2023-10-16 MED ORDER — LEVOTHYROXINE SODIUM 100 MCG PO TABS: 100.0000 ug | ORAL_TABLET | Freq: Every day | ORAL | 1 refills | Status: DC

## 2023-11-20 ENCOUNTER — Ambulatory Visit: Payer: Medicare HMO

## 2023-11-20 DIAGNOSIS — E039 Hypothyroidism, unspecified: Secondary | ICD-10-CM

## 2023-11-21 LAB — THYROID PANEL WITH TSH
Free Thyroxine Index: 1.4 (ref 1.4–3.8)
T3 Uptake: 29 % (ref 22–35)
T4, Total: 4.9 ug/dL (ref 4.9–10.5)
TSH: 0.86 m[IU]/L (ref 0.40–4.50)

## 2023-12-11 ENCOUNTER — Other Ambulatory Visit: Payer: Self-pay

## 2023-12-11 ENCOUNTER — Other Ambulatory Visit: Payer: Self-pay | Admitting: Family Medicine

## 2023-12-11 DIAGNOSIS — E039 Hypothyroidism, unspecified: Secondary | ICD-10-CM

## 2023-12-11 MED ORDER — LEVOTHYROXINE SODIUM 100 MCG PO TABS: 88.0000 ug | ORAL_TABLET | Freq: Every day | ORAL | 0 refills | Status: DC

## 2023-12-11 MED ORDER — LEVOTHYROXINE SODIUM 100 MCG PO TABS: 100.0000 ug | ORAL_TABLET | Freq: Every day | ORAL | 1 refills | Status: DC

## 2023-12-11 NOTE — Telephone Encounter (Signed)
 Copied from CRM [id number]. Topic: Clinical - Medication Refill >> Dec 11, 2023  9:05 AM Carlatta H wrote: Most Recent Primary Care Visit:  Provider: [facility]  Department: [facility]  Visit Type: OFFICE VISIT  Date: 11/20/2023  Medication: levothyroxine  (SYNTHROID ) 88 MCG tablet [[id number]]  Has the patient contacted their pharmacy? Yes (Agent: If no, request that the patient contact the pharmacy for the refill. If patient does not wish to contact the pharmacy document the reason why and proceed with request.) (Agent: If yes, when and what did the pharmacy advise?)  Is this the correct pharmacy for this prescription? Yes If no, delete pharmacy and type the correct one.  This is the patient's preferred pharmacy:  [facility] [id number] - [place], [place] - [address] AT [facility] OF [address] & [address] [place] [address] Phone: [phone] Fax: [phone]   Has the prescription been filled recently? No  Is the patient out of the medication? Yes  Has the patient been seen for an appointment in the last year OR does the patient have an upcoming appointment? Yes  Can we respond through MyChart? No  Agent: Please be advised that Rx refills may take up to 3 business days. We ask that you follow-up with your pharmacy.

## 2024-01-22 ENCOUNTER — Other Ambulatory Visit: Payer: Self-pay | Admitting: Family Medicine

## 2024-03-28 DIAGNOSIS — H2513 Age-related nuclear cataract, bilateral: Secondary | ICD-10-CM | POA: Diagnosis not present

## 2024-03-28 DIAGNOSIS — H524 Presbyopia: Secondary | ICD-10-CM | POA: Diagnosis not present

## 2024-03-28 DIAGNOSIS — D3132 Benign neoplasm of left choroid: Secondary | ICD-10-CM | POA: Diagnosis not present

## 2024-03-28 DIAGNOSIS — H5203 Hypermetropia, bilateral: Secondary | ICD-10-CM | POA: Diagnosis not present

## 2024-03-28 DIAGNOSIS — H33322 Round hole, left eye: Secondary | ICD-10-CM | POA: Diagnosis not present

## 2024-03-28 DIAGNOSIS — H52223 Regular astigmatism, bilateral: Secondary | ICD-10-CM | POA: Diagnosis not present

## 2024-03-28 DIAGNOSIS — D3131 Benign neoplasm of right choroid: Secondary | ICD-10-CM | POA: Diagnosis not present

## 2024-04-05 ENCOUNTER — Other Ambulatory Visit: Payer: Medicare HMO

## 2024-04-05 DIAGNOSIS — E039 Hypothyroidism, unspecified: Secondary | ICD-10-CM | POA: Diagnosis not present

## 2024-04-05 DIAGNOSIS — E7800 Pure hypercholesterolemia, unspecified: Secondary | ICD-10-CM

## 2024-04-05 DIAGNOSIS — Z125 Encounter for screening for malignant neoplasm of prostate: Secondary | ICD-10-CM | POA: Diagnosis not present

## 2024-04-06 LAB — LIPID PANEL
Cholesterol: 99 mg/dL (ref <200)
HDL: 28 mg/dL — ABNORMAL LOW (ref >=40)
LDL Cholesterol (Calc): 50 mg/dL
Non-HDL Cholesterol (Calc): 71 mg/dL (ref <130)
Total CHOL/HDL Ratio: 3.5 (calc) (ref <5.0)
Triglycerides: 129 mg/dL (ref <150)

## 2024-04-06 LAB — COMPLETE METABOLIC PANEL WITHOUT GFR
AG Ratio: 1.7 (calc) (ref 1.0–2.5)
ALT: 20 U/L (ref 9–46)
AST: 22 U/L (ref 10–35)
Albumin: 4.1 g/dL (ref 3.6–5.1)
Alkaline phosphatase (APISO): 58 U/L (ref 35–144)
BUN: 15 mg/dL (ref 7–25)
CO2: 29 mmol/L (ref 20–32)
Calcium: 9.2 mg/dL (ref 8.6–10.3)
Chloride: 94 mmol/L — ABNORMAL LOW (ref 98–110)
Creat: 0.9 mg/dL (ref 0.70–1.28)
Globulin: 2.4 g/dL (ref 1.9–3.7)
Glucose, Bld: 90 mg/dL (ref 65–99)
Potassium: 4.6 mmol/L (ref 3.5–5.3)
Sodium: 129 mmol/L — ABNORMAL LOW (ref 135–146)
Total Bilirubin: 0.6 mg/dL (ref 0.2–1.2)
Total Protein: 6.5 g/dL (ref 6.1–8.1)

## 2024-04-06 LAB — CBC WITH DIFFERENTIAL/PLATELET
Absolute Lymphocytes: 1449 {cells}/uL (ref 850–3900)
Absolute Monocytes: 559 {cells}/uL (ref 200–950)
Basophils Absolute: 50 {cells}/uL (ref 0–200)
Basophils Relative: 1.2 %
Eosinophils Absolute: 181 {cells}/uL (ref 15–500)
Eosinophils Relative: 4.3 %
HCT: 41.4 % (ref 38.5–50.0)
Hemoglobin: 14.2 g/dL (ref 13.2–17.1)
MCH: 30 pg (ref 27.0–33.0)
MCHC: 34.3 g/dL (ref 32.0–36.0)
MCV: 87.3 fL (ref 80.0–100.0)
MPV: 10.3 fL (ref 7.5–12.5)
Monocytes Relative: 13.3 %
Neutro Abs: 1961 {cells}/uL (ref 1500–7800)
Neutrophils Relative %: 46.7 %
Platelets: 151 10*3/uL (ref 140–400)
RBC: 4.74 10*6/uL (ref 4.20–5.80)
RDW: 12.7 % (ref 11.0–15.0)
Total Lymphocyte: 34.5 %
WBC: 4.2 10*3/uL (ref 3.8–10.8)

## 2024-04-06 LAB — TSH: TSH: 0.11 m[IU]/L — ABNORMAL LOW (ref 0.40–4.50)

## 2024-04-06 LAB — PSA: PSA: 0.12 ng/mL (ref <=4.00)

## 2024-04-11 ENCOUNTER — Ambulatory Visit: Payer: Medicare HMO | Admitting: Family Medicine

## 2024-04-11 ENCOUNTER — Encounter: Payer: Self-pay | Admitting: Family Medicine

## 2024-04-11 VITALS — BP 120/62 | HR 74 | Temp 97.9°F | Ht 71.0 in | Wt 223.8 lb

## 2024-04-11 DIAGNOSIS — E039 Hypothyroidism, unspecified: Secondary | ICD-10-CM

## 2024-04-11 DIAGNOSIS — E7800 Pure hypercholesterolemia, unspecified: Secondary | ICD-10-CM

## 2024-04-11 DIAGNOSIS — E871 Hypo-osmolality and hyponatremia: Secondary | ICD-10-CM

## 2024-04-11 LAB — BASIC METABOLIC PANEL WITHOUT GFR
BUN: 15 mg/dL (ref 7–25)
CO2: 28 mmol/L (ref 20–32)
Calcium: 9 mg/dL (ref 8.6–10.3)
Chloride: 90 mmol/L — ABNORMAL LOW (ref 98–110)
Creat: 0.8 mg/dL (ref 0.70–1.28)
Glucose, Bld: 88 mg/dL (ref 65–99)
Potassium: 4.2 mmol/L (ref 3.5–5.3)
Sodium: 124 mmol/L — ABNORMAL LOW (ref 135–146)

## 2024-04-11 MED ORDER — MELOXICAM 15 MG PO TABS: 15.0000 mg | ORAL_TABLET | Freq: Every day | ORAL | 2 refills | Status: DC

## 2024-04-11 MED ORDER — PRAVASTATIN SODIUM 20 MG PO TABS: 20.0000 mg | ORAL_TABLET | Freq: Every day | ORAL | 3 refills | Status: DC

## 2024-04-11 MED ORDER — LEVOTHYROXINE SODIUM 100 MCG PO TABS: 100.0000 ug | ORAL_TABLET | Freq: Every day | ORAL | 1 refills | Status: DC

## 2024-04-11 NOTE — Progress Notes (Signed)
 Subjective:    Patient ID: Ricky Hoffman, male    DOB: [birth year], 73 y.o.   MRN: [id number]  HPI Patient is a very pleasant 73 year old Caucasian gentleman here today for follow up.  Patient had very few medical problems until 2010.  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  He was trying to lift a wrestling mat when he suffered a severe back injury.  Per his report he herniated disc which essentially crushed his spinal cord.  He underwent 3 different surgeries and had resultant nerve damage after the back surgeries.  He was essentially confined to a wheelchair with weakness, unrelenting back pain, and numbness in his legs.  He states that when he tried to stand he would fall due to the weakness and nerve damage.  He battled depression.  He states that he was essentially confined to a wheelchair.  At 1 point he had not even been outside in months because of the pain in his back.  He was on disability.  He was taking Nucynta, Flexeril , gabapentin, and other pain medications.  About 1 year ago, the patient made a conscious decision to wean himself off pain medication.  He is no longer taking any pain medicine.    He is currently on Crestor  20 mg a day for hyperlipidemia, levothyroxine  88 mcg daily for hypothyroidism.  He is still disabled.  Recent labs show: Lab on 04/05/2024  Component Date Value Ref Range Status   WBC 04/05/2024 4.2  3.8 - 10.8 Thousand/uL Final   RBC 04/05/2024 4.74  4.20 - 5.80 Million/uL Final   Hemoglobin 04/05/2024 14.2  13.2 - 17.1 g/dL Final   HCT 28/41/3244 41.4  38.5 - 50.0 % Final   MCV 04/05/2024 87.3  80.0 - 100.0 fL Final   MCH 04/05/2024 30.0  27.0 - 33.0 pg Final   MCHC 04/05/2024 34.3  32.0 - 36.0 g/dL Final   Comment: For adults, a slight decrease in the calculated MCHC value (in the range of 30 to 32 g/dL) is most likely not clinically significant; however, it should be interpreted with caution in correlation with other red cell parameters and  the patient's clinical condition.    RDW 04/05/2024 12.7  11.0 - 15.0 % Final   Platelets 04/05/2024 151  140 - 400 Thousand/uL Final   MPV 04/05/2024 10.3  7.5 - 12.5 fL Final   Neutro Abs 04/05/2024 1,961  1,500 - 7,800 cells/uL Final   Absolute Lymphocytes 04/05/2024 1,449  850 - 3,900 cells/uL Final   Absolute Monocytes 04/05/2024 559  200 - 950 cells/uL Final   Eosinophils Absolute 04/05/2024 181  15 - 500 cells/uL Final   Basophils Absolute 04/05/2024 50  0 - 200 cells/uL Final   Neutrophils Relative % 04/05/2024 46.7  % Final   Total Lymphocyte 04/05/2024 34.5  % Final   Monocytes Relative 04/05/2024 13.3  % Final   Eosinophils Relative 04/05/2024 4.3  % Final   Basophils Relative 04/05/2024 1.2  % Final   Glucose, Bld 04/05/2024 90  65 - 99 mg/dL Final   Comment: .            Fasting reference interval .    BUN 04/05/2024 15  7 - 25 mg/dL Final   Creat 12/07/7251 0.90  0.70 - 1.28 mg/dL Final   BUN/Creatinine Ratio 04/05/2024 SEE NOTE:  6 - 22 (calc) Final   Comment:    Not Reported: BUN and Creatinine are within  reference range. .    Sodium 04/05/2024 129 (L)  135 - 146 mmol/L Final   Potassium 04/05/2024 4.6  3.5 - 5.3 mmol/L Final   Chloride 04/05/2024 94 (L)  98 - 110 mmol/L Final   CO2 04/05/2024 29  20 - 32 mmol/L Final   Calcium  04/05/2024 9.2  8.6 - 10.3 mg/dL Final   Total Protein 13/07/6577 6.5  6.1 - 8.1 g/dL Final   Albumin 46/96/2952 4.1  3.6 - 5.1 g/dL Final   Globulin 84/13/2440 2.4  1.9 - 3.7 g/dL (calc) Final   AG Ratio 04/05/2024 1.7  1.0 - 2.5 (calc) Final   Total Bilirubin 04/05/2024 0.6  0.2 - 1.2 mg/dL Final   Alkaline phosphatase (APISO) 04/05/2024 58  35 - 144 U/L Final   AST 04/05/2024 22  10 - 35 U/L Final   ALT 04/05/2024 20  9 - 46 U/L Final   Cholesterol 04/05/2024 99  <200 mg/dL Final   HDL 10/01/2535 28 (L)  > OR = 40 mg/dL Final   Triglycerides 64/40/3474 129  <150 mg/dL Final   LDL Cholesterol (Calc) 04/05/2024 50  mg/dL (calc)  Final   Comment: Reference range: <100 . Desirable range <100 mg/dL for primary prevention;   <70 mg/dL for patients with CHD or diabetic patients  with > or = 2 CHD risk factors. Aaron Aas LDL-C is now calculated using the Martin-Hopkins  calculation, which is a validated novel method providing  better accuracy than the Friedewald equation in the  estimation of LDL-C.  Melinda Sprawls et al. Erroll Heard. 2595;638(75): 2061-2068  (http://education.[facility].com/faq/FAQ164)    Total CHOL/HDL Ratio 04/05/2024 3.5  <6.4 (calc) Final   Non-HDL Cholesterol (Calc) 04/05/2024 71  <130 mg/dL (calc) Final   Comment: For patients with diabetes plus 1 major ASCVD risk  factor, treating to a non-HDL-C goal of <100 mg/dL  (LDL-C of <33 mg/dL) is considered a therapeutic  option.    TSH 04/05/2024 0.11 (L)  0.40 - 4.50 mIU/L Final   PSA 04/05/2024 0.12  < OR = 4.00 ng/mL Final   Comment: The total PSA value from this assay system is  standardized against the WHO standard. The test  result will be approximately 20% lower when compared  to the equimolar-standardized total PSA (Beckman  Coulter). Comparison of serial PSA results should be  interpreted with this fact in mind. . This test was performed using the Siemens  chemiluminescent method. Values obtained from  different assay methods cannot be used interchangeably. PSA levels, regardless of value, should not be interpreted as absolute evidence of the presence or absence of disease.   Patient believes hyponatremia.  He does report that he drinks a lot of water every day.  He denies any dizziness or headaches or seizures or nausea or fatigue.  He does complain about severe low back pain as well as neuropathic pain in his legs.  Past Medical History:  Diagnosis Date   Anxiety    Arthritis    Gout    Hyperlipemia    Hypothyroidism    Neuromuscular disorder (HCC)    Sleep apnea    uses Cpap   Past Surgical History:  Procedure Laterality Date    BACK SURGERY     x 3   COLONOSCOPY N/A 04/12/2013   Procedure: COLONOSCOPY;  Surgeon: Almeda Aris, MD;  Location: [facility];  Service: Endoscopy;  Laterality: N/A;   COLONOSCOPY WITH PROPOFOL  N/A 08/11/2023   Procedure: COLONOSCOPY WITH PROPOFOL ;  Surgeon: Alvis Jourdain, MD;  Location: [facility];  Service: Gastroenterology;  Laterality: N/A;   MULTIPLE TOOTH EXTRACTIONS     POLYPECTOMY  08/11/2023   Procedure: POLYPECTOMY;  Surgeon: Alvis Jourdain, MD;  Location: [facility];  Service: Gastroenterology;;   Current Outpatient Medications on File Prior to Visit  Medication Sig Dispense Refill   colchicine  0.6 MG tablet Take 1 tablet (0.6 mg total) by mouth 2 (two) times daily. 3 tablet 0   levothyroxine  (SYNTHROID ) 100 MCG tablet Take 1 tablet (100 mcg total) by mouth daily before breakfast. 90 tablet 0   levothyroxine  (SYNTHROID ) 100 MCG tablet Take 1 tablet (100 mcg total) by mouth daily before breakfast. 90 tablet 1   pravastatin (PRAVACHOL) 20 MG tablet Take 20 mg by mouth daily.     triamcinolone  cream (KENALOG ) 0.1 % Apply 1 Application topically 2 (two) times daily. 30 g 0   No current facility-administered medications on file prior to visit.   No Known Allergies Social History   Socioeconomic History   Marital status: Married    Spouse name: Not on file   Number of children: Not on file   Years of education: Not on file   Highest education level: Not on file  Occupational History   Not on file  Tobacco Use   Smoking status: Never   Smokeless tobacco: Never  Vaping Use   Vaping status: Never Used  Substance and Sexual Activity   Alcohol use: No   Drug use: No   Sexual activity: Not on file  Other Topics Concern   Not on file  Social History Narrative   Not on file   Social Drivers of Health   Financial Resource Strain: Low Risk  (07/27/2023)   Overall Financial Resource Strain (CARDIA)    Difficulty of Paying Living Expenses: Not hard at all  Food Insecurity:  No Food Insecurity (07/27/2023)   Hunger Vital Sign    Worried About Running Out of Food in the Last Year: Never true    Ran Out of Food in the Last Year: Never true  Transportation Needs: No Transportation Needs (07/27/2023)   PRAPARE - Administrator, Civil Service (Medical): No    Lack of Transportation (Non-Medical): No  Physical Activity: Insufficiently Active (07/27/2023)   Exercise Vital Sign    Days of Exercise per Week: 3 days    Minutes of Exercise per Session: 30 min  Stress: No Stress Concern Present (07/27/2023)   [employer] of Occupational Health - Occupational Stress Questionnaire    Feeling of Stress : Not at all  Social Connections: Moderately Integrated (07/27/2023)   Social Connection and Isolation Panel [NHANES]    Frequency of Communication with Friends and Family: More than three times a week    Frequency of Social Gatherings with Friends and Family: Three times a week    Attends Religious Services: 1 to 4 times per year    Active Member of Clubs or Organizations: No    Attends Banker Meetings: Never    Marital Status: Married  Catering manager Violence: Not At Risk (07/27/2023)   Humiliation, Afraid, Rape, and Kick questionnaire    Fear of Current or Ex-Partner: No    Emotionally Abused: No    Physically Abused: No    Sexually Abused: No    Review of Systems  All other systems reviewed and are negative.      Objective:   Physical Exam Vitals reviewed.  Constitutional:      General: He is  not in acute distress.    Appearance: Normal appearance. He is not ill-appearing or toxic-appearing.  Neck:     Vascular: No carotid bruit.  Cardiovascular:     Rate and Rhythm: Normal rate and regular rhythm.     Pulses: Normal pulses.     Heart sounds: Normal heart sounds. No murmur heard.    No friction rub. No gallop.  Pulmonary:     Effort: Pulmonary effort is normal. No respiratory distress.     Breath sounds: Normal breath  sounds. No stridor. No wheezing, rhonchi or rales.  Abdominal:     General: Bowel sounds are normal. There is no distension.     Palpations: Abdomen is soft.     Tenderness: There is no abdominal tenderness. There is no guarding.     Hernia: No hernia is present.  Musculoskeletal:     Lumbar back: Tenderness and bony tenderness present. Decreased range of motion.     Right lower leg: No edema.     Left lower leg: No edema.  Lymphadenopathy:     Cervical: No cervical adenopathy.  Neurological:     General: No focal deficit present.     Mental Status: He is alert and oriented to person, place, and time. Mental status is at baseline.     Cranial Nerves: No cranial nerve deficit.     Motor: No weakness.     Coordination: Coordination normal.     Gait: Gait normal.           Assessment & Plan:   Hyponatremia - Plan: Basic Metabolic Panel Without GFR  Hypothyroidism, unspecified type - Plan: levothyroxine  (SYNTHROID ) 100 MCG tablet  Pure hypercholesterolemia I am concerned by his hyponatremia.  Repeat BMP to confirm diagnosis.  If sodium levels remain low, institute fluid restriction to 40 ounces a day.  Blood pressure today is excellent.  Cholesterol is outstanding.  Trial meloxicam 15 mg daily for low back pain.

## 2024-04-18 ENCOUNTER — Other Ambulatory Visit

## 2024-04-18 ENCOUNTER — Other Ambulatory Visit: Payer: Self-pay | Admitting: Family Medicine

## 2024-04-18 DIAGNOSIS — G4733 Obstructive sleep apnea (adult) (pediatric): Secondary | ICD-10-CM

## 2024-04-18 DIAGNOSIS — E871 Hypo-osmolality and hyponatremia: Secondary | ICD-10-CM | POA: Diagnosis not present

## 2024-04-19 ENCOUNTER — Ambulatory Visit: Payer: Self-pay | Admitting: Family Medicine

## 2024-04-19 ENCOUNTER — Encounter: Payer: Self-pay | Admitting: Family Medicine

## 2024-04-19 DIAGNOSIS — E222 Syndrome of inappropriate secretion of antidiuretic hormone: Secondary | ICD-10-CM

## 2024-04-19 HISTORY — DX: Syndrome of inappropriate secretion of antidiuretic hormone: E22.2

## 2024-04-19 LAB — BASIC METABOLIC PANEL WITH GFR
BUN: 16 mg/dL (ref 7–25)
CO2: 29 mmol/L (ref 20–32)
Calcium: 9.4 mg/dL (ref 8.6–10.3)
Chloride: 98 mmol/L (ref 98–110)
Creat: 0.96 mg/dL (ref 0.70–1.28)
Glucose, Bld: 84 mg/dL (ref 65–99)
Potassium: 4.6 mmol/L (ref 3.5–5.3)
Sodium: 134 mmol/L — ABNORMAL LOW (ref 135–146)
eGFR: 85 mL/min/{1.73_m2} (ref >=60)

## 2024-04-19 NOTE — Telephone Encounter (Signed)
 No longer listed on current medication list Requested Prescriptions  Pending Prescriptions Disp Refills   rosuvastatin  (CRESTOR ) 20 MG tablet [Pharmacy Med Name: ROSUVASTATIN  20MG  TABLETS] 90 tablet 3    Sig: TAKE 1 TABLET(20 MG) BY MOUTH DAILY     Cardiovascular:  Antilipid - Statins 2 Failed - 04/19/2024  1:07 PM      Failed - Lipid Panel in normal range within the last 12 months    Cholesterol  Date Value Ref Range Status  04/05/2024 99 <200 mg/dL Final   LDL Cholesterol (Calc)  Date Value Ref Range Status  04/05/2024 50 mg/dL (calc) Final    Comment:    Reference range: <100 . Desirable range <100 mg/dL for primary prevention;   <70 mg/dL for patients with CHD or diabetic patients  with > or = 2 CHD risk factors. Aaron Aas LDL-C is now calculated using the Martin-Hopkins  calculation, which is a validated novel method providing  better accuracy than the Friedewald equation in the  estimation of LDL-C.  Melinda Sprawls et al. Erroll Heard. 1610;960(45): 2061-2068  (http://education.[facility].com/faq/FAQ164)    Direct LDL  Date Value Ref Range Status  10/12/2023 72 <100 mg/dL Final    Comment:    Greatly elevated Triglycerides values (>1200 mg/dL) interfere with the dLDL assay. Aaron Aas Desirable range <100 mg/dL for primary prevention;   <70 mg/dL for patients with CHD or diabetic patients  with > or = 2 CHD risk factors. Aaron Aas    HDL  Date Value Ref Range Status  04/05/2024 28 (L) > OR = 40 mg/dL Final   Triglycerides  Date Value Ref Range Status  04/05/2024 129 <150 mg/dL Final         Passed - Cr in normal range and within 360 days    Creat  Date Value Ref Range Status  04/18/2024 0.96 0.70 - 1.28 mg/dL Final         Passed - Patient is not pregnant      Passed - Valid encounter within last 12 months    Recent Outpatient Visits           1 week ago Hyponatremia   [facility] Pickard, Cisco Crest, MD   6 months ago Pure hypercholesterolemia    [place] [facility] Jenelle Mis, FNP   1 year ago Pure hypercholesterolemia   [place] Pickard, Cisco Crest, MD

## 2024-06-20 ENCOUNTER — Other Ambulatory Visit: Payer: Self-pay | Admitting: Family Medicine

## 2024-06-20 DIAGNOSIS — E039 Hypothyroidism, unspecified: Secondary | ICD-10-CM

## 2024-06-21 NOTE — Telephone Encounter (Signed)
 Too soon for refill.  Requested Prescriptions  Pending Prescriptions Disp Refills   levothyroxine  (SYNTHROID ) 100 MCG tablet [Pharmacy Med Name: LEVOTHYROXINE  0.100MG  ( ) TAB] 90 tablet 1    Sig: TAKE 1 TABLET(100 MCG) BY MOUTH DAILY BEFORE BREAKFAST     Endocrinology:  Hypothyroid Agents Failed - 06/21/2024  1:51 PM      Failed - TSH in normal range and within 360 days    TSH  Date Value Ref Range Status  04/05/2024 0.11 (L) 0.40 - 4.50 mIU/L Final         Passed - Valid encounter within last 12 months    Recent Outpatient Visits           2 months ago Hyponatremia   [facility] Pickard, Butler DASEN, MD   8 months ago Pure hypercholesterolemia   [place] [facility] Kayla Jeoffrey RAMAN, FNP   1 year ago Pure hypercholesterolemia    [facility] Pickard, Butler DASEN, MD

## 2024-08-01 ENCOUNTER — Ambulatory Visit: Payer: Medicare HMO | Admitting: *Deleted

## 2024-08-01 VITALS — Ht 71.0 in | Wt 203.0 lb

## 2024-08-01 DIAGNOSIS — Z0000 Encounter for general adult medical examination without abnormal findings: Secondary | ICD-10-CM

## 2024-08-01 NOTE — Progress Notes (Signed)
 Subjective:   Ricky Hoffman is a 73 y.o. male who presents for Medicare Annual/Subsequent preventive examination.  Visit Complete: Virtual I connected with  [facility] on 08/01/24 by a audio enabled telemedicine application and verified that I am speaking with the correct person using two identifiers.  Patient Location: Home  Provider Location: Home Office  I discussed the limitations of evaluation and management by telemedicine. The patient expressed understanding and agreed to proceed.  Vital Signs: Because this visit was a virtual/telehealth visit, some criteria may be missing or patient reported. Any vitals not documented were not able to be obtained and vitals that have been documented are patient reported.   Cardiac Risk Factors include: advanced age (>26men, >42 women);male gender;obesity (BMI >30kg/m2)     Objective:    Today's Vitals   08/01/24 1444  Weight: 203 lb (92.1 kg)  Height: 5' 11 (1.803 m)  PainSc: 3    Body mass index is 28.31 kg/m.     08/01/2024    2:45 PM 08/11/2023    8:06 AM 07/27/2023    5:54 PM 02/26/2022    2:28 AM 07/12/2018   12:04 AM 04/12/2013    8:20 AM  Advanced Directives  Does Patient Have a Medical Advance Directive? No No No No No  Patient does not have advance directive   Would patient like information on creating a medical advance directive? No - Patient declined  Yes (MAU/Ambulatory/Procedural Areas - Information given) No - Patient declined No - Patient declined    Pre-existing out of facility DNR order (yellow form or pink MOST form)      No      Data saved with a previous flowsheet row definition    Current Medications (verified) Outpatient Encounter Medications as of 08/01/2024  Medication Sig   colchicine  0.6 MG tablet Take 1 tablet (0.6 mg total) by mouth 2 (two) times daily. (Patient taking differently: Take 0.6 mg by mouth 2 (two) times daily. PRN)   levothyroxine  (SYNTHROID ) 100 MCG tablet Take 1 tablet (100 mcg  total) by mouth daily before breakfast.   meloxicam  (MOBIC ) 15 MG tablet Take 1 tablet (15 mg total) by mouth daily.   pravastatin  (PRAVACHOL ) 20 MG tablet Take 1 tablet (20 mg total) by mouth daily.   No facility-administered encounter medications on file as of 08/01/2024.    Allergies (verified) Patient has no known allergies.   History: Past Medical History:  Diagnosis Date   Anxiety    Arthritis    Gout    Hyperlipemia    Hypothyroidism    Neuromuscular disorder (HCC)    SIADH (syndrome of inappropriate ADH production) (HCC) 04/19/2024   Sleep apnea    uses Cpap   Past Surgical History:  Procedure Laterality Date   BACK SURGERY     x 3   COLONOSCOPY N/A 04/12/2013   Procedure: COLONOSCOPY;  Surgeon: Belvie JONETTA Just, MD;  Location: [facility];  Service: Endoscopy;  Laterality: N/A;   COLONOSCOPY WITH PROPOFOL  N/A 08/11/2023   Procedure: COLONOSCOPY WITH PROPOFOL ;  Surgeon: Just Belvie, MD;  Location: [facility];  Service: Gastroenterology;  Laterality: N/A;   MULTIPLE TOOTH EXTRACTIONS     POLYPECTOMY  08/11/2023   Procedure: POLYPECTOMY;  Surgeon: Just Belvie, MD;  Location: [facility];  Service: Gastroenterology;;   History reviewed. No pertinent family history. Social History   Socioeconomic History   Marital status: Married    Spouse name: Not on file   Number of children: Not  on file   Years of education: Not on file   Highest education level: Not on file  Occupational History   Not on file  Tobacco Use   Smoking status: Never   Smokeless tobacco: Never  Vaping Use   Vaping status: Never Used  Substance and Sexual Activity   Alcohol use: No   Drug use: No   Sexual activity: Not Currently  Other Topics Concern   Not on file  Social History Narrative   Not on file   Social Drivers of Health   Financial Resource Strain: Low Risk  (08/01/2024)   Overall Financial Resource Strain (CARDIA)    Difficulty of Paying Living Expenses: Not hard at all   Food Insecurity: No Food Insecurity (08/01/2024)   Hunger Vital Sign    Worried About Running Out of Food in the Last Year: Never true    Ran Out of Food in the Last Year: Never true  Transportation Needs: No Transportation Needs (08/01/2024)   PRAPARE - Administrator, Civil Service (Medical): No    Lack of Transportation (Non-Medical): No  Physical Activity: Insufficiently Active (08/01/2024)   Exercise Vital Sign    Days of Exercise per Week: 2 days    Minutes of Exercise per Session: 30 min  Stress: No Stress Concern Present (08/01/2024)   [employer] of Occupational Health - Occupational Stress Questionnaire    Feeling of Stress: Not at all  Social Connections: Moderately Integrated (08/01/2024)   Social Connection and Isolation Panel    Frequency of Communication with Friends and Family: More than three times a week    Frequency of Social Gatherings with Friends and Family: Twice a week    Attends Religious Services: More than 4 times per year    Active Member of [employer] or Organizations: No    Attends Engineer, structural: Never    Marital Status: Married    Tobacco Counseling Counseling given: Not Answered   Clinical Intake:  Pre-visit preparation completed: Yes  Pain : 0-10 Pain Score: 3  Pain Type: Chronic pain Pain Location: Back Pain Descriptors / Indicators: Aching, Dull     Diabetes: No  How often do you need to have someone help you when you read instructions, pamphlets, or other written materials from your doctor or pharmacy?: 1 - Never  Interpreter Needed?: No  Information entered by :: Mliss Graff LPN   Activities of Daily Living    08/01/2024    2:47 PM  In your present state of health, do you have any difficulty performing the following activities:  Hearing? 0  Vision? 0  Difficulty concentrating or making decisions? 0  Walking or climbing stairs? 0  Dressing or bathing? 0  Doing errands, shopping? 0  Preparing Food  and eating ? N  Using the Toilet? N  In the past six months, have you accidently leaked urine? N  Do you have problems with loss of bowel control? N  Managing your Medications? N  Managing your Finances? N  Housekeeping or managing your Housekeeping? N    Patient Care Team: Duanne Butler DASEN, MD as PCP - General (Family Medicine)  Indicate any recent Medical Services you may have received from other than [facility] providers in the past year (date may be approximate).     Assessment:   This is a routine wellness examination for [place].  Hearing/Vision screen Hearing Screening - Comments:: No trouble hearing Vision Screening - Comments:: Up to date My eye doctor  Goals Addressed             This Visit's Progress    Patient Stated       [place]      Remain active and independent   On track      Depression Screen    08/01/2024    3:04 PM 04/11/2024    7:58 AM 10/12/2023    7:57 AM 07/27/2023    5:53 PM  PHQ 2/9 Scores  PHQ - 2 Score 0 0 0 0    Fall Risk    08/01/2024    2:44 PM 04/11/2024    7:58 AM 10/12/2023    7:57 AM 07/27/2023    5:54 PM  Fall Risk   Falls in the past year? 0 0  0  Number falls in past yr: 0 0 0 0  Injury with Fall? 0 0 0 0  Risk for fall due to :  No Fall Risks  No Fall Risks  Follow up Falls evaluation completed;Education provided;Falls prevention discussed Falls prevention discussed;Falls evaluation completed  Falls prevention discussed;Education provided;Falls evaluation completed    MEDICARE RISK AT HOME: Medicare Risk at Home Any stairs in or around the home?: No If so, are there any without handrails?: No Home free of loose throw rugs in walkways, pet beds, electrical cords, etc?: Yes Adequate lighting in your home to reduce risk of falls?: Yes Life alert?: No Use of a cane, walker or w/c?: No Grab bars in the bathroom?: Yes Shower chair or bench in shower?: No Elevated toilet seat or a handicapped toilet?: No  TIMED UP AND  GO:  Was the test performed?  No    Cognitive Function:        08/01/2024    2:48 PM 07/27/2023    5:55 PM  6CIT Screen  What Year? 0 points 0 points  What month? 0 points 0 points  What time? 0 points 0 points  Count back from 20 0 points 0 points  Months in reverse 0 points 0 points  Repeat phrase 0 points 0 points  Total Score 0 points 0 points    Immunizations Immunization History  Administered Date(s) Administered   Fluad Trivalent(High Dose 65+) 09/27/2023   Influenza, Quadrivalent, Recombinant, Inj, Pf 09/23/2020, 09/27/2021   Influenza,inj,Quad PF,6+ Mos 10/02/2017    TDAP status: Due, Education has been provided regarding the importance of this vaccine. Advised may receive this vaccine at local pharmacy or Health Dept. Aware to provide a copy of the vaccination record if obtained from local pharmacy or Health Dept. Verbalized acceptance and understanding.  Flu Vaccine status: Due, Education has been provided regarding the importance of this vaccine. Advised may receive this vaccine at local pharmacy or Health Dept. Aware to provide a copy of the vaccination record if obtained from local pharmacy or Health Dept. Verbalized acceptance and understanding.  Pneumococcal vaccine status: Due, Education has been provided regarding the importance of this vaccine. Advised may receive this vaccine at local pharmacy or Health Dept. Aware to provide a copy of the vaccination record if obtained from local pharmacy or Health Dept. Verbalized acceptance and understanding.  Covid-19 vaccine status: Information provided on how to obtain vaccines.   Qualifies for Shingles Vaccine?  UNABLE TO VERIFY  PATIENT STATES HAD DONE  Zostavax completed No   Shingrix Completed?: No.    Education has been provided regarding the importance of this vaccine. Patient has been advised to call insurance company to determine out of pocket  expense if they have not yet received this vaccine. Advised may also  receive vaccine at local pharmacy or Health Dept. Verbalized acceptance and understanding.  Screening Tests Health Maintenance  Topic Date Due   Zoster Vaccines- Shingrix (1 of 2) Never done   INFLUENZA VACCINE  07/05/2024   DTaP/Tdap/Td (1 - Tdap) 04/11/2025 (Originally 06/22/1970)   Pneumococcal Vaccine: 50+ Years (1 of 1 - PCV) 04/11/2025 (Originally 06/23/2003)   Medicare Annual Wellness (AWV)  08/01/2025   Colonoscopy  08/10/2033   Hepatitis C Screening  Completed   HPV VACCINES  Aged Out   Meningococcal B Vaccine  Aged Out   COVID-19 Vaccine  Discontinued    Health Maintenance  Health Maintenance Due  Topic Date Due   Zoster Vaccines- Shingrix (1 of 2) Never done   INFLUENZA VACCINE  07/05/2024    Colorectal cancer screening: No longer required.   Lung Cancer Screening: (Low Dose CT Chest recommended if Age 52-80 years, 20 pack-year currently smoking OR have quit w/in 15years.)  qualify.   Lung Cancer Screening Referral:   Additional Screening:  Hepatitis C Screening: does not qualify; Completed 2024  Vision Screening: Recommended annual ophthalmology exams for early detection of glaucoma and other disorders of the eye. Is the patient up to date with their annual eye exam?  Yes  Who is the provider or what is the name of the office in which the patient attends annual eye exams? [facility] If pt is not established with a provider, would they like to be referred to a provider to establish care? No .   Dental Screening: Recommended annual dental exams for proper oral hygiene    Community Resource Referral / Chronic Care Management: CRR required this visit?  No   CCM required this visit?  No     Plan:     I have personally reviewed and noted the following in the patient's chart:   Medical and social history Use of alcohol, tobacco or illicit drugs  Current medications and supplements including opioid prescriptions. Patient is not currently taking opioid  prescriptions. Functional ability and status Nutritional status Physical activity Advanced directives List of other physicians Hospitalizations, surgeries, and ER visits in previous 12 months Vitals Screenings to include cognitive, depression, and falls Referrals and appointments  In addition, I have reviewed and discussed with patient certain preventive protocols, quality metrics, and best practice recommendations. A written personalized care plan for preventive services as well as general preventive health recommendations were provided to patient.     Mliss Graff, LPN   1/71/7974   After Visit Summary: (MyChart) Due to this being a telephonic visit, the after visit summary with patients personalized plan was offered to patient via MyChart   Nurse Notes:

## 2024-08-01 NOTE — Patient Instructions (Signed)
 Ricky Hoffman , Thank you for taking time to come for your Medicare Wellness Visit. I appreciate your ongoing commitment to your health goals. Please review the following plan we discussed and let me know if I can assist you in the future.   Screening recommendations/referrals: Colonoscopy: up to date Recommended yearly ophthalmology/optometry visit for glaucoma screening and checkup Recommended yearly dental visit for hygiene and checkup  Vaccinations: Influenza vaccine:  Pneumococcal vaccine:  Tdap vaccine:  Shingles vaccine:        Preventive Care 65 Years and Older, Male Preventive care refers to lifestyle choices and visits with your health care provider that can promote health and wellness. What does preventive care include? A yearly physical exam. This is also called an annual well check. Dental exams once or twice a year. Routine eye exams. Ask your health care provider how often you should have your eyes checked. Personal lifestyle choices, including: Daily care of your teeth and gums. Regular physical activity. Eating a healthy diet. Avoiding tobacco and drug use. Limiting alcohol use. Practicing safe sex. Taking low doses of aspirin every day. Taking vitamin and mineral supplements as recommended by your health care provider. What happens during an annual well check? The services and screenings done by your health care provider during your annual well check will depend on your age, overall health, lifestyle risk factors, and family history of disease. Counseling  Your health care provider may ask you questions about your: Alcohol use. Tobacco use. Drug use. Emotional well-being. Home and relationship well-being. Sexual activity. Eating habits. History of falls. Memory and ability to understand (cognition). Work and work Astronomer. Screening  You may have the following tests or measurements: Height, weight, and BMI. Blood pressure. Lipid and cholesterol  levels. These may be checked every 5 years, or more frequently if you are over 53 years old. Skin check. Lung cancer screening. You may have this screening every year starting at age 40 if you have a 30-pack-year history of smoking and currently smoke or have quit within the past 15 years. Fecal occult blood test (FOBT) of the stool. You may have this test every year starting at age 68. Flexible sigmoidoscopy or colonoscopy. You may have a sigmoidoscopy every 5 years or a colonoscopy every 10 years starting at age 75. Prostate cancer screening. Recommendations will vary depending on your family history and other risks. Hepatitis C blood test. Hepatitis B blood test. Sexually transmitted disease (STD) testing. Diabetes screening. This is done by checking your blood sugar (glucose) after you have not eaten for a while (fasting). You may have this done every 1-3 years. Abdominal aortic aneurysm (AAA) screening. You may need this if you are a current or former smoker. Osteoporosis. You may be screened starting at age 67 if you are at high risk. Talk with your health care provider about your test results, treatment options, and if necessary, the need for more tests. Vaccines  Your health care provider may recommend certain vaccines, such as: Influenza vaccine. This is recommended every year. Tetanus, diphtheria, and acellular pertussis (Tdap, Td) vaccine. You may need a Td booster every 10 years. Zoster vaccine. You may need this after age 72. Pneumococcal 13-valent conjugate (PCV13) vaccine. One dose is recommended after age 47. Pneumococcal polysaccharide (PPSV23) vaccine. One dose is recommended after age 71. Talk to your health care provider about which screenings and vaccines you need and how often you need them. This information is not intended to replace advice given to you by  your health care provider. Make sure you discuss any questions you have with your health care provider. Document  Released: 12/18/2015 Document Revised: 08/10/2016 Document Reviewed: 09/22/2015 Elsevier Interactive Patient Education  2017 [employer].  Fall Prevention in the Home Falls can cause injuries. They can happen to people of all ages. There are many things you can do to make your home safe and to help prevent falls. What can I do on the outside of my home? Regularly fix the edges of walkways and driveways and fix any cracks. Remove anything that might make you trip as you walk through a door, such as a raised step or threshold. Trim any bushes or trees on the path to your home. Use bright outdoor lighting. Clear any walking paths of anything that might make someone trip, such as rocks or tools. Regularly check to see if handrails are loose or broken. Make sure that both sides of any steps have handrails. Any raised decks and porches should have guardrails on the edges. Have any leaves, snow, or ice cleared regularly. Use sand or salt on walking paths during winter. Clean up any spills in your garage right away. This includes oil or grease spills. What can I do in the bathroom? Use night lights. Install grab bars by the toilet and in the tub and shower. Do not use towel bars as grab bars. Use non-skid mats or decals in the tub or shower. If you need to sit down in the shower, use a plastic, non-slip stool. Keep the floor dry. Clean up any water that spills on the floor as soon as it happens. Remove soap buildup in the tub or shower regularly. Attach bath mats securely with double-sided non-slip rug tape. Do not have throw rugs and other things on the floor that can make you trip. What can I do in the bedroom? Use night lights. Make sure that you have a light by your bed that is easy to reach. Do not use any sheets or blankets that are too big for your bed. They should not hang down onto the floor. Have a firm chair that has side arms. You can use this for support while you get dressed. Do  not have throw rugs and other things on the floor that can make you trip. What can I do in the kitchen? Clean up any spills right away. Avoid walking on wet floors. Keep items that you use a lot in easy-to-reach places. If you need to reach something above you, use a strong step stool that has a grab bar. Keep electrical cords out of the way. Do not use floor polish or wax that makes floors slippery. If you must use wax, use non-skid floor wax. Do not have throw rugs and other things on the floor that can make you trip. What can I do with my stairs? Do not leave any items on the stairs. Make sure that there are handrails on both sides of the stairs and use them. Fix handrails that are broken or loose. Make sure that handrails are as long as the stairways. Check any carpeting to make sure that it is firmly attached to the stairs. Fix any carpet that is loose or worn. Avoid having throw rugs at the top or bottom of the stairs. If you do have throw rugs, attach them to the floor with carpet tape. Make sure that you have a light switch at the top of the stairs and the bottom of the stairs. If you  do not have them, ask someone to add them for you. What else can I do to help prevent falls? Wear shoes that: Do not have high heels. Have rubber bottoms. Are comfortable and fit you well. Are closed at the toe. Do not wear sandals. If you use a stepladder: Make sure that it is fully opened. Do not climb a closed stepladder. Make sure that both sides of the stepladder are locked into place. Ask someone to hold it for you, if possible. Clearly mark and make sure that you can see: Any grab bars or handrails. First and last steps. Where the edge of each step is. Use tools that help you move around (mobility aids) if they are needed. These include: Canes. Walkers. Scooters. Crutches. Turn on the lights when you go into a dark area. Replace any light bulbs as soon as they burn out. Set up your  furniture so you have a clear path. Avoid moving your furniture around. If any of your floors are uneven, fix them. If there are any pets around you, be aware of where they are. Review your medicines with your doctor. Some medicines can make you feel dizzy. This can increase your chance of falling. Ask your doctor what other things that you can do to help prevent falls. This information is not intended to replace advice given to you by your health care provider. Make sure you discuss any questions you have with your health care provider. Document Released: 09/17/2009 Document Revised: 04/28/2016 Document Reviewed: 12/26/2014 Elsevier Interactive Patient Education  2017 [employer].

## 2024-08-07 ENCOUNTER — Other Ambulatory Visit: Payer: Self-pay | Admitting: Family Medicine

## 2024-08-07 NOTE — Telephone Encounter (Signed)
 Copied from CRM #[id number]. Topic: Clinical - Medication Refill >> Aug 07, 2024  2:12 PM Carla L wrote: Medication: meloxicam  (MOBIC ) 15 MG tablet  Has the patient contacted their pharmacy? Yes Told to contact the office for further refills.  This is the patient's preferred pharmacy:  [id number] - [place], [place] - [address] AT [facility] [address] [place] [address] Phone: [phone] Fax: [phone]  Is this the correct pharmacy for this prescription? Yes  Has the prescription been filled recently? No  Is the patient out of the medication? Yes  Has the patient been seen for an appointment in the last year OR does the patient have an upcoming appointment? Yes  Can we respond through MyChart? No  Agent: Please be advised that Rx refills may take up to 3 business days. We ask that you follow-up with your pharmacy.

## 2024-08-08 MED ORDER — MELOXICAM 15 MG PO TABS: 15.0000 mg | ORAL_TABLET | Freq: Every day | ORAL | 0 refills | Status: DC

## 2024-08-08 NOTE — Telephone Encounter (Signed)
 Requested Prescriptions  Pending Prescriptions Disp Refills   meloxicam  (MOBIC ) 15 MG tablet 90 tablet 0    Sig: Take 1 tablet (15 mg total) by mouth daily.     Analgesics:  COX2 Inhibitors Failed - 08/08/2024 12:04 PM      Failed - Manual Review: Labs are only required if the patient has taken medication for more than 8 weeks.      Passed - HGB in normal range and within 360 days    Hemoglobin  Date Value Ref Range Status  04/05/2024 14.2 13.2 - 17.1 g/dL Final         Passed - Cr in normal range and within 360 days    Creat  Date Value Ref Range Status  04/18/2024 0.96 0.70 - 1.28 mg/dL Final         Passed - HCT in normal range and within 360 days    HCT  Date Value Ref Range Status  04/05/2024 41.4 38.5 - 50.0 % Final         Passed - AST in normal range and within 360 days    AST  Date Value Ref Range Status  04/05/2024 22 10 - 35 U/L Final         Passed - ALT in normal range and within 360 days    ALT  Date Value Ref Range Status  04/05/2024 20 9 - 46 U/L Final         Passed - eGFR is 30 or above and within 360 days    GFR calc Af Amer  Date Value Ref Range Status  07/12/2018 >60 >60 mL/min Final    Comment:    (NOTE) The eGFR has been calculated using the CKD EPI equation. This calculation has not been validated in all clinical situations. eGFR's persistently <60 mL/min signify possible Chronic Kidney Disease.    GFR calc non Af Amer  Date Value Ref Range Status  07/12/2018 >60 >60 mL/min Final   eGFR  Date Value Ref Range Status  04/18/2024 85 > OR = 60 mL/min/1.38m2 Final         Passed - Patient is not pregnant      Passed - Valid encounter within last 12 months    Recent Outpatient Visits           3 months ago Hyponatremia   [place] [facility] Pickard, Butler DASEN, MD   10 months ago Pure hypercholesterolemia   [place] [facility] Kayla Jeoffrey RAMAN, FNP   1 year ago Pure hypercholesterolemia    [place] [facility] Pickard, Butler DASEN, MD

## 2024-08-12 ENCOUNTER — Other Ambulatory Visit: Payer: Self-pay | Admitting: Family Medicine

## 2024-08-12 NOTE — Telephone Encounter (Signed)
 Copied from CRM [id number]. Topic: Clinical - Prescription Issue >> Aug 12, 2024  8:11 AM Macario HERO wrote: Reason for CRM: Patient stated his refill request for meloxicam  (MOBIC ) 15 MG tablet [[id number]] [facility]

## 2024-08-13 MED ORDER — MELOXICAM 15 MG PO TABS: 15.0000 mg | ORAL_TABLET | Freq: Every day | ORAL | 0 refills | Status: DC

## 2024-08-13 NOTE — Telephone Encounter (Signed)
 Requested Prescriptions  Pending Prescriptions Disp Refills   meloxicam  (MOBIC ) 15 MG tablet 90 tablet 0    Sig: Take 1 tablet (15 mg total) by mouth daily.     Analgesics:  COX2 Inhibitors Failed - 08/13/2024 11:10 AM      Failed - Manual Review: Labs are only required if the patient has taken medication for more than 8 weeks.      Passed - HGB in normal range and within 360 days    Hemoglobin  Date Value Ref Range Status  04/05/2024 14.2 13.2 - 17.1 g/dL Final         Passed - Cr in normal range and within 360 days    Creat  Date Value Ref Range Status  04/18/2024 0.96 0.70 - 1.28 mg/dL Final         Passed - HCT in normal range and within 360 days    HCT  Date Value Ref Range Status  04/05/2024 41.4 38.5 - 50.0 % Final         Passed - AST in normal range and within 360 days    AST  Date Value Ref Range Status  04/05/2024 22 10 - 35 U/L Final         Passed - ALT in normal range and within 360 days    ALT  Date Value Ref Range Status  04/05/2024 20 9 - 46 U/L Final         Passed - eGFR is 30 or above and within 360 days    GFR calc Af Amer  Date Value Ref Range Status  07/12/2018 >60 >60 mL/min Final    Comment:    (NOTE) The eGFR has been calculated using the CKD EPI equation. This calculation has not been validated in all clinical situations. eGFR's persistently <60 mL/min signify possible Chronic Kidney Disease.    GFR calc non Af Amer  Date Value Ref Range Status  07/12/2018 >60 >60 mL/min Final   eGFR  Date Value Ref Range Status  04/18/2024 85 > OR = 60 mL/min/1.58m2 Final         Passed - Patient is not pregnant      Passed - Valid encounter within last 12 months    Recent Outpatient Visits           4 months ago Hyponatremia   [facility] Pickard, Butler DASEN, MD   10 months ago Pure hypercholesterolemia   [facility] Kayla Jeoffrey RAMAN, FNP   1 year ago Pure hypercholesterolemia    [facility] Duanne Butler DASEN, MD               Resent because [facility] received [facility] rx from 08/08/2024.

## 2024-10-11 ENCOUNTER — Telehealth: Payer: Self-pay

## 2024-10-11 ENCOUNTER — Other Ambulatory Visit

## 2024-10-11 DIAGNOSIS — E039 Hypothyroidism, unspecified: Secondary | ICD-10-CM

## 2024-10-11 DIAGNOSIS — E222 Syndrome of inappropriate secretion of antidiuretic hormone: Secondary | ICD-10-CM

## 2024-10-11 DIAGNOSIS — Z125 Encounter for screening for malignant neoplasm of prostate: Secondary | ICD-10-CM

## 2024-10-11 DIAGNOSIS — E7800 Pure hypercholesterolemia, unspecified: Secondary | ICD-10-CM

## 2024-10-11 NOTE — Telephone Encounter (Signed)
 Pt here this morning for labs. Pt asks if you would be willing to take his wife on as a patient? Wife is Hanad Leino, who currently sees [facility]. Reena had surgery on her wrist and is having issues with the vein or artery in her wrist. Mr. Swanger states area is swollen. He states they called the surgeon and he is recommending the patient see vascular and vein physician but pt will need a referral. Mr. Muse asks if there is any way you can see her and refer her? Thank you.

## 2024-10-14 ENCOUNTER — Ambulatory Visit: Payer: Self-pay | Admitting: Family Medicine

## 2024-10-14 ENCOUNTER — Ambulatory Visit: Admitting: Family Medicine

## 2024-10-14 VITALS — BP 122/78 | HR 74 | Temp 98.0°F | Ht 71.0 in | Wt 207.2 lb

## 2024-10-14 DIAGNOSIS — Z0000 Encounter for general adult medical examination without abnormal findings: Secondary | ICD-10-CM

## 2024-10-14 DIAGNOSIS — E039 Hypothyroidism, unspecified: Secondary | ICD-10-CM

## 2024-10-14 DIAGNOSIS — Z23 Encounter for immunization: Secondary | ICD-10-CM

## 2024-10-14 DIAGNOSIS — E7800 Pure hypercholesterolemia, unspecified: Secondary | ICD-10-CM

## 2024-10-14 DIAGNOSIS — Z0001 Encounter for general adult medical examination with abnormal findings: Secondary | ICD-10-CM

## 2024-10-14 MED ORDER — LEVOTHYROXINE SODIUM 88 MCG PO TABS: 88.0000 ug | ORAL_TABLET | Freq: Every day | ORAL | 3 refills | Status: AC

## 2024-10-14 MED ORDER — PRAVASTATIN SODIUM 20 MG PO TABS: 20.0000 mg | ORAL_TABLET | Freq: Every day | ORAL | 3 refills | Status: AC

## 2024-10-14 MED ORDER — MELOXICAM 15 MG PO TABS
15.0000 mg | ORAL_TABLET | Freq: Every day | ORAL | 0 refills | Status: AC
Start: 2024-10-14 — End: ?

## 2024-10-14 NOTE — Progress Notes (Signed)
 Subjective:    Patient ID: Ricky Hoffman, male    DOB: 09-29-1951, 73 y.o.   MRN: [id number]  HPI Patient is a very pleasant 73 year old Caucasian gentleman here today for cpe.  Patient had very few medical problems until 2010.  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  He was trying to lift a wrestling mat when he suffered a severe back injury.  Per his report he herniated disc which essentially crushed his spinal cord.  He underwent 3 different surgeries and had resultant nerve damage after the back surgeries.  He was essentially confined to a wheelchair with weakness, unrelenting back pain, and numbness in his legs.  He states that when he tried to stand he would fall due to the weakness and nerve damage.  He battled depression.  He states that he was essentially confined to a wheelchair.  At 1 point he had not even been outside in months because of the pain in his back.  He was on disability.  He was taking Nucynta, Flexeril , gabapentin, and other pain medications.  About 1 year ago, the patient made a conscious decision to wean himself off pain medication.  He is no longer taking any pain medicine.    He is currently on Crestor  20 mg a day for hyperlipidemia, levothyroxine  88 mcg daily for hypothyroidism.  He is still disabled.  Had colonoscopy 9/24 that revealed 1 polyp.  Patient is due for the flu shot which he would like to receive today.  He has had the pneumonia vaccine and the shingles vaccine at a local pharmacy.  We discussed COVID and RSV.  Otherwise he is doing pretty well aside from chronic low back pain.  He states the meloxicam  is helping him manage this.  He denies any GI upset and his most recent renal function is good. Lab on 10/11/2024  Component Date Value Ref Range Status   WBC 10/11/2024 5.2  3.8 - 10.8 Thousand/uL Final   RBC 10/11/2024 5.13  4.20 - 5.80 Million/uL Final   Hemoglobin 10/11/2024 15.9  13.2 - 17.1 g/dL Final   HCT 88/92/7974 47.6  38.5 - 50.0 %  Final   MCV 10/11/2024 92.8  80.0 - 100.0 fL Final   MCH 10/11/2024 31.0  27.0 - 33.0 pg Final   MCHC 10/11/2024 33.4  32.0 - 36.0 g/dL Final   Comment: For adults, a slight decrease in the calculated MCHC value (in the range of 30 to 32 g/dL) is most likely not clinically significant; however, it should be interpreted with caution in correlation with other red cell parameters and the patient's clinical condition.    RDW 10/11/2024 12.9  11.0 - 15.0 % Final   Platelets 10/11/2024 154  140 - 400 Thousand/uL Final   MPV 10/11/2024 10.5  7.5 - 12.5 fL Final   Neutro Abs 10/11/2024 2,574  1,500 - 7,800 cells/uL Final   Absolute Lymphocytes 10/11/2024 1,919  850 - 3,900 cells/uL Final   Absolute Monocytes 10/11/2024 400  200 - 950 cells/uL Final   Eosinophils Absolute 10/11/2024 229  15 - 500 cells/uL Final   Basophils Absolute 10/11/2024 78  0 - 200 cells/uL Final   Neutrophils Relative % 10/11/2024 49.5  % Final   Total Lymphocyte 10/11/2024 36.9  % Final   Monocytes Relative 10/11/2024 7.7  % Final   Eosinophils Relative 10/11/2024 4.4  % Final   Basophils Relative 10/11/2024 1.5  % Final   Glucose, Bld 10/11/2024 87  65 - 99 mg/dL Final   Comment: .            Fasting reference interval .    BUN 10/11/2024 21  7 - 25 mg/dL Final   Creat 88/92/7974 0.94  0.70 - 1.28 mg/dL Final   BUN/Creatinine Ratio 10/11/2024 SEE NOTE:  6 - 22 (calc) Final   Comment:    Not Reported: BUN and Creatinine are within    reference range. .    Sodium 10/11/2024 139  135 - 146 mmol/L Final   Potassium 10/11/2024 5.1  3.5 - 5.3 mmol/L Final   Chloride 10/11/2024 103  98 - 110 mmol/L Final   CO2 10/11/2024 32  20 - 32 mmol/L Final   Calcium  10/11/2024 9.8  8.6 - 10.3 mg/dL Final   Total Protein 88/92/7974 6.8  6.1 - 8.1 g/dL Final   Albumin 88/92/7974 4.4  3.6 - 5.1 g/dL Final   Globulin 88/92/7974 2.4  1.9 - 3.7 g/dL (calc) Final   AG Ratio 10/11/2024 1.8  1.0 - 2.5 (calc) Final   Total Bilirubin  10/11/2024 0.7  0.2 - 1.2 mg/dL Final   Alkaline phosphatase (APISO) 10/11/2024 61  35 - 144 U/L Final   AST 10/11/2024 17  10 - 35 U/L Final   ALT 10/11/2024 13  9 - 46 U/L Final   Cholesterol 10/11/2024 161  <200 mg/dL Final   HDL 88/92/7974 39 (L)  > OR = 40 mg/dL Final   Triglycerides 88/92/7974 107  <150 mg/dL Final   LDL Cholesterol (Calc) 10/11/2024 102 (H)  mg/dL (calc) Final   Comment: Reference range: <100 . Desirable range <100 mg/dL for primary prevention;   <70 mg/dL for patients with CHD or diabetic patients  with > or = 2 CHD risk factors. SABRA LDL-C is now calculated using the Martin-Hopkins  calculation, which is a validated novel method providing  better accuracy than the Friedewald equation in the  estimation of LDL-C.  Gladis APPLETHWAITE et al. SANDREA. 7986;689(80): 2061-2068  (http://education.[facility].com/faq/FAQ164)    Total CHOL/HDL Ratio 10/11/2024 4.1  <4.9 (calc) Final   Non-HDL Cholesterol (Calc) 10/11/2024 122  <130 mg/dL (calc) Final   Comment: For patients with diabetes plus 1 major ASCVD risk  factor, treating to a non-HDL-C goal of <100 mg/dL  (LDL-C of <29 mg/dL) is considered a therapeutic  option.    TSH 10/11/2024 0.18 (L)  0.40 - 4.50 mIU/L Final    Past Medical History:  Diagnosis Date   Anxiety    Arthritis    Gout    Hyperlipemia    Hypothyroidism    Neuromuscular disorder (HCC)    SIADH (syndrome of inappropriate ADH production) 04/19/2024   Sleep apnea    uses Cpap   Past Surgical History:  Procedure Laterality Date   BACK SURGERY     x 3   COLONOSCOPY N/A 04/12/2013   Procedure: COLONOSCOPY;  Surgeon: Belvie JONETTA Just, MD;  Location: [facility];  Service: Endoscopy;  Laterality: N/A;   COLONOSCOPY WITH PROPOFOL  N/A 08/11/2023   Procedure: COLONOSCOPY WITH PROPOFOL ;  Surgeon: Just Belvie, MD;  Location: [facility];  Service: Gastroenterology;  Laterality: N/A;   MULTIPLE TOOTH EXTRACTIONS     POLYPECTOMY  08/11/2023   Procedure:  POLYPECTOMY;  Surgeon: Just Belvie, MD;  Location: [facility];  Service: Gastroenterology;;   Current Outpatient Medications on File Prior to Visit  Medication Sig Dispense Refill   colchicine  0.6 MG tablet Take 1 tablet (0.6 mg total) by mouth  2 (two) times daily. (Patient taking differently: Take 0.6 mg by mouth 2 (two) times daily. PRN) 3 tablet 0   levothyroxine  (SYNTHROID ) 100 MCG tablet Take 1 tablet (100 mcg total) by mouth daily before breakfast. 90 tablet 1   meloxicam  (MOBIC ) 15 MG tablet Take 1 tablet (15 mg total) by mouth daily. 90 tablet 0   pravastatin  (PRAVACHOL ) 20 MG tablet Take 1 tablet (20 mg total) by mouth daily. 90 tablet 3   No current facility-administered medications on file prior to visit.   No Known Allergies Social History   Socioeconomic History   Marital status: Married    Spouse name: Not on file   Number of children: Not on file   Years of education: Not on file   Highest education level: Not on file  Occupational History   Not on file  Tobacco Use   Smoking status: Never   Smokeless tobacco: Never  Vaping Use   Vaping status: Never Used  Substance and Sexual Activity   Alcohol use: No   Drug use: No   Sexual activity: Not Currently  Other Topics Concern   Not on file  Social History Narrative   Not on file   Social Drivers of Health   Financial Resource Strain: Low Risk  (08/01/2024)   Overall Financial Resource Strain (CARDIA)    Difficulty of Paying Living Expenses: Not hard at all  Food Insecurity: No Food Insecurity (08/01/2024)   Hunger Vital Sign    Worried About Running Out of Food in the Last Year: Never true    Ran Out of Food in the Last Year: Never true  Transportation Needs: No Transportation Needs (08/01/2024)   PRAPARE - Administrator, Civil Service (Medical): No    Lack of Transportation (Non-Medical): No  Physical Activity: Insufficiently Active (08/01/2024)   Exercise Vital Sign    Days of Exercise per  Week: 2 days    Minutes of Exercise per Session: 30 min  Stress: No Stress Concern Present (08/01/2024)   [employer] of Occupational Health - Occupational Stress Questionnaire    Feeling of Stress: Not at all  Social Connections: Moderately Integrated (08/01/2024)   Social Connection and Isolation Panel    Frequency of Communication with Friends and Family: More than three times a week    Frequency of Social Gatherings with Friends and Family: Twice a week    Attends Religious Services: More than 4 times per year    Active Member of [employer] or Organizations: No    Attends Banker Meetings: Never    Marital Status: Married  Catering Manager Violence: Not At Risk (08/01/2024)   Humiliation, Afraid, Rape, and Kick questionnaire    Fear of Current or Ex-Partner: No    Emotionally Abused: No    Physically Abused: No    Sexually Abused: No    Review of Systems  All other systems reviewed and are negative.      Objective:   Physical Exam Vitals reviewed.  Constitutional:      General: He is not in acute distress.    Appearance: Normal appearance. He is not ill-appearing or toxic-appearing.  Neck:     Vascular: No carotid bruit.  Cardiovascular:     Rate and Rhythm: Normal rate and regular rhythm.     Pulses: Normal pulses.     Heart sounds: Normal heart sounds. No murmur heard.    No friction rub. No gallop.  Pulmonary:  Effort: Pulmonary effort is normal. No respiratory distress.     Breath sounds: Normal breath sounds. No stridor. No wheezing, rhonchi or rales.  Abdominal:     General: Bowel sounds are normal. There is no distension.     Palpations: Abdomen is soft.     Tenderness: There is no abdominal tenderness. There is no guarding.     Hernia: No hernia is present.  Musculoskeletal:     Lumbar back: Tenderness and bony tenderness present. Decreased range of motion.     Right lower leg: No edema.     Left lower leg: No edema.  Lymphadenopathy:      Cervical: No cervical adenopathy.  Neurological:     General: No focal deficit present.     Mental Status: He is alert and oriented to person, place, and time. Mental status is at baseline.     Cranial Nerves: No cranial nerve deficit.     Motor: No weakness.     Coordination: Coordination normal.     Gait: Gait normal.           Assessment & Plan:  General medical exam  Hypothyroidism, unspecified type  Pure hypercholesterolemia Blood pressure today is outstanding.  Cholesterol is acceptable.  Appears to be taking slightly too much levothyroxine .  I reduce the dose to 88 mcg a day.  Patient received his flu shot.  I did recommend RSV and COVID to him.  Colonoscopy is no longer required.  I will add a PSA to his lab work to screen for prostate cancer.  I cautioned the patient about GI toxicity from chronic NSAID use.  Also discussed the potential damage to the kidneys that they can cause and recommend we monitor that every 6 months.

## 2024-10-15 LAB — COMPLETE METABOLIC PANEL WITHOUT GFR
AG Ratio: 1.8 (calc) (ref 1.0–2.5)
ALT: 13 U/L (ref 9–46)
AST: 17 U/L (ref 10–35)
Albumin: 4.4 g/dL (ref 3.6–5.1)
Alkaline phosphatase (APISO): 61 U/L (ref 35–144)
BUN: 21 mg/dL (ref 7–25)
CO2: 32 mmol/L (ref 20–32)
Calcium: 9.8 mg/dL (ref 8.6–10.3)
Chloride: 103 mmol/L (ref 98–110)
Creat: 0.94 mg/dL (ref 0.70–1.28)
Globulin: 2.4 g/dL (ref 1.9–3.7)
Glucose, Bld: 87 mg/dL (ref 65–99)
Potassium: 5.1 mmol/L (ref 3.5–5.3)
Sodium: 139 mmol/L (ref 135–146)
Total Bilirubin: 0.7 mg/dL (ref 0.2–1.2)
Total Protein: 6.8 g/dL (ref 6.1–8.1)

## 2024-10-15 LAB — TEST AUTHORIZATION

## 2024-10-15 LAB — CBC WITH DIFFERENTIAL/PLATELET
Absolute Lymphocytes: 1919 {cells}/uL (ref 850–3900)
Absolute Monocytes: 400 {cells}/uL (ref 200–950)
Basophils Absolute: 78 {cells}/uL (ref 0–200)
Basophils Relative: 1.5 %
Eosinophils Absolute: 229 {cells}/uL (ref 15–500)
Eosinophils Relative: 4.4 %
HCT: 47.6 % (ref 38.5–50.0)
Hemoglobin: 15.9 g/dL (ref 13.2–17.1)
MCH: 31 pg (ref 27.0–33.0)
MCHC: 33.4 g/dL (ref 32.0–36.0)
MCV: 92.8 fL (ref 80.0–100.0)
MPV: 10.5 fL (ref 7.5–12.5)
Monocytes Relative: 7.7 %
Neutro Abs: 2574 {cells}/uL (ref 1500–7800)
Neutrophils Relative %: 49.5 %
Platelets: 154 Thousand/uL (ref 140–400)
RBC: 5.13 Million/uL (ref 4.20–5.80)
RDW: 12.9 % (ref 11.0–15.0)
Total Lymphocyte: 36.9 %
WBC: 5.2 Thousand/uL (ref 3.8–10.8)

## 2024-10-15 LAB — LIPID PANEL
Cholesterol: 161 mg/dL (ref <200)
HDL: 39 mg/dL — ABNORMAL LOW (ref >=40)
LDL Cholesterol (Calc): 102 mg/dL — ABNORMAL HIGH
Non-HDL Cholesterol (Calc): 122 mg/dL (ref <130)
Total CHOL/HDL Ratio: 4.1 (calc) (ref <5.0)
Triglycerides: 107 mg/dL (ref <150)

## 2024-10-15 LAB — PSA: PSA: 0.15 ng/mL (ref <=4.00)

## 2024-10-15 LAB — TSH: TSH: 0.18 m[IU]/L — ABNORMAL LOW (ref 0.40–4.50)

## 2025-04-14 ENCOUNTER — Ambulatory Visit: Admitting: Family Medicine

## 2025-08-07 ENCOUNTER — Encounter
